# Patient Record
Sex: Female | Born: 1950 | Race: White | Hispanic: No | Marital: Married | Smoking: Current some day smoker
Health system: Southern US, Community
[De-identification: ages and names within clinical notes are randomized; demographics above are authoritative.]

## PROBLEM LIST (undated history)

## (undated) DIAGNOSIS — R188 Other ascites: Secondary | ICD-10-CM

## (undated) DIAGNOSIS — I10 Essential (primary) hypertension: Secondary | ICD-10-CM

## (undated) DIAGNOSIS — K7460 Unspecified cirrhosis of liver: Secondary | ICD-10-CM

## (undated) DIAGNOSIS — E119 Type 2 diabetes mellitus without complications: Secondary | ICD-10-CM

## (undated) DIAGNOSIS — E785 Hyperlipidemia, unspecified: Secondary | ICD-10-CM

## (undated) DIAGNOSIS — M1990 Unspecified osteoarthritis, unspecified site: Secondary | ICD-10-CM

## (undated) HISTORY — PX: SHOULDER SURGERY: SHX246

## (undated) HISTORY — DX: Unspecified cirrhosis of liver: K74.60

## (undated) HISTORY — DX: Other ascites: R18.8

## (undated) HISTORY — PX: ABDOMINAL HYSTERECTOMY: SHX81

## (undated) HISTORY — DX: Type 2 diabetes mellitus without complications: E11.9

## (undated) HISTORY — DX: Essential (primary) hypertension: I10

## (undated) HISTORY — DX: Hyperlipidemia, unspecified: E78.5

## (undated) HISTORY — DX: Unspecified osteoarthritis, unspecified site: M19.90

## (undated) HISTORY — PX: APPENDECTOMY: SHX54

## (undated) HISTORY — PX: CARPAL TUNNEL RELEASE: SHX101

---

## 2007-10-03 ENCOUNTER — Ambulatory Visit: Payer: Self-pay | Admitting: Cardiology

## 2007-10-04 ENCOUNTER — Inpatient Hospital Stay (HOSPITAL_COMMUNITY): Admission: AD | Admit: 2007-10-04 | Discharge: 2007-10-05 | Payer: Self-pay | Admitting: Cardiology

## 2007-10-04 ENCOUNTER — Ambulatory Visit: Payer: Self-pay | Admitting: Cardiology

## 2007-10-25 ENCOUNTER — Ambulatory Visit: Payer: Self-pay | Admitting: Cardiology

## 2009-04-30 ENCOUNTER — Encounter (INDEPENDENT_AMBULATORY_CARE_PROVIDER_SITE_OTHER): Payer: Self-pay | Admitting: Urology

## 2009-04-30 ENCOUNTER — Ambulatory Visit (HOSPITAL_COMMUNITY): Admission: RE | Admit: 2009-04-30 | Discharge: 2009-04-30 | Payer: Self-pay | Admitting: Urology

## 2011-02-20 LAB — BASIC METABOLIC PANEL
BUN: 10 mg/dL (ref 6–23)
CO2: 27 mEq/L (ref 19–32)
Calcium: 9.2 mg/dL (ref 8.4–10.5)
Chloride: 109 mEq/L (ref 96–112)
Creatinine, Ser: 0.67 mg/dL (ref 0.4–1.2)
Potassium: 4.5 mEq/L (ref 3.5–5.1)
Sodium: 139 mEq/L (ref 135–145)

## 2011-03-28 NOTE — Cardiovascular Report (Signed)
Erin Wilson, Erin Wilson               ACCOUNT NO.:  [id number]   MEDICAL RECORD NO.:  [id number]          PATIENT TYPE:  INP   LOCATION:  3730                         FACILITY:  [facility]   PHYSICIAN:  Everardo Beals. Juanda Chance, MD, FACCDATE OF BIRTH:  11/09/1951   DATE OF PROCEDURE:  10/04/2007  DATE OF DISCHARGE:                            CARDIAC CATHETERIZATION   CLINICAL HISTORY:  Erin Wilson is 60 years old and has known coronary  artery disease.  She had a stent placed in the right coronary at [facility] in 2002.  Recently she developed symptoms of exertional chest  pressure over the last several weeks.  She was admitted to [facility] with chest pain and was seen in consultation by Dr. Myrtis Ser.  Her  ECG showed anterior T wave inversion which was new since her previous  ECG in 1999.  She was transferred to [facility] for evaluation with  angiography with a  presumptive diagnosis of unstable angina.   PROCEDURE:  The procedure was performed via the right femoral artery  using an arterial sheath and 6-French preformed coronary catheters.  A  front wall arterial puncture was performed.  Omnipaque contrast was  used.  The right femoral artery was closed with Angio-Seal at the end of  the procedure  The patient tolerated the procedure well and left the  laboratory in satisfactory condition.  There was some spasm in the right  coronary artery at the site of the catheter, and we gave intracoronary  nitroglycerin and performed repeat injections and the spasm had  resolved.   RESULTS:  The left main coronary was free of significant disease.   The left anterior descending artery gave rise to two septal perforators  and a diagonal branch.  The LAD was small in caliber in the mid and  distal portion, but it was smooth, and there was no obvious obstruction.   The circumflex artery gave rise to two small marginal branches, a third  marginal branch, and two posterolateral branches.  These  vessels were  free of significant disease.   The right coronary artery was a moderately large vessel and gave rise to  a right ventricular branch, a posterior descending branch, and a  posterolateral branch.  There was less than 20% narrowing at the stent  site in the distal right coronary artery.  There was 50% narrowing in  the distal right coronary beyond the stent before the posterior  descending branch.  There were some irregularities in the proximal and  midportion of the vessel.  There was catheter-induced spasm at the  ostium which resolved with nitroglycerin.   The left ventriculogram performed in the RAO projection showed good wall  motion with no areas of hypokinesis.  The estimated ejection fraction  was 60%.   CONCLUSION:  Nonobstructive coronary artery disease with no significant  obstruction in the left anterior descending and circumflex arteries,  less than 20% narrowing at the stent site in the distal right coronary,  and 50% narrowing in the distal right coronary artery beyond the stent  with normal left ventricular function.  RECOMMENDATIONS:  The patient has nonobstructive disease with no clear  source of ischemia.  In view of these findings, I suspect her recent  symptoms are probably not related to myocardial ischemia.  She feels  some of her symptoms may be related to deconditioning.  We will plan to  keep her tonight and plan discharge tomorrow with followup with Dr. Myrtis Ser  and Dr. Linna Darner.      Bruce Elvera Lennox Juanda Chance, MD, [facility]  Electronically Signed     BRB/MEDQ  D:  10/04/2007  T:  10/05/2007  Job:  [id number]   cc:   Erasmo Downer, MD  Luis Abed, MD, [facility]  Cardiopulmonary Lab

## 2011-03-28 NOTE — Cardiovascular Report (Signed)
Erin Wilson, Erin Wilson               ACCOUNT NO.:  [id number]   MEDICAL RECORD NO.:  [id number]          PATIENT TYPE:  INP   LOCATION:  3730                         FACILITY:  [facility]   PHYSICIAN:  Everardo Beals. Juanda Chance, MD, FACCDATE OF BIRTH:  [birth year]   DATE OF PROCEDURE:  10/04/2007  DATE OF DISCHARGE:  10/05/2007                            CARDIAC CATHETERIZATION   PAST MEDICAL HISTORY:  Ms. Tristan is a 60 year old, who had a previous  stent placed in the right coronary artery at [facility].  She is  admitted to [facility] with chest pain and T-wave changes and was  transferred to [facility] hospital for evaluation with angiography.   PROCEDURE:  The procedure was performed via the femoral artery with  arterial sheath and 6-French preformed coronary catheters.  A femoral  arterial puncture was performed and Omnipaque contrast was used.  The  patient tolerated the procedure well and left the laboratory in  satisfactory condition.   RESULTS:  Left main coronary is free of disease.   Left anterior descending artery gave rise to the diagonal branch and two  septal perforators.  There was mild diffuse narrowing in the LAD, but no  significant obstruction.   The circumflex artery gave rise to two small marginal branches, a large  marginal branch and two posterolateral branches.  These vessels were  free of significant disease.   The right coronary was a moderate-sized vessel which gave rise to a the  right ventricular branch, posterior descending branch and the  posterolateral branch.  There was less than 20% narrowing at the stent  site in the distal right coronary artery.  There was 50% narrowing  distal to the stent.  There is some catheter spasm in the proximal right  coronary which resolved with nitroglycerin.   The left ventricle from the RAO projection showed good wall motion with  no areas of hypokinesis.  Estimated ejection fraction was 60%.   CONCLUSION:   Nonobstructive coronary artery disease with mild diffuse  narrowing in the LAD, no significant obstruction of circumflex artery,  less than 20% narrowing at the stent site in the mid to distal right  coronary artery and 50% narrowing in the distal right coronary with  normal LV function.   RECOMMENDATIONS:  Reassurance.  We will discuss with Dr. Myrtis Ser regarding  further evaluation of the patient's stent.      Bruce Elvera Lennox Juanda Chance, MD, [facility]  Electronically Signed     BRB/MEDQ  D:  12/03/2007  T:  12/04/2007  Job:  [id number]   cc:   Erasmo Downer, MD  Luis Abed, MD, [facility]  Bruce R. Juanda Chance, MD, [facility]  Cardiopulmonary Lab

## 2011-03-28 NOTE — Discharge Summary (Signed)
Erin, Wilson NO.:  [id number]   MEDICAL RECORD NO.:  [id number]          PATIENT TYPE:  INP   LOCATION:  3730                         FACILITY:  [facility]   PHYSICIAN:  Luis Abed, MD, FACCDATE OF BIRTH:  03-May-1951   DATE OF ADMISSION:  10/04/2007  DATE OF DISCHARGE:  10/05/2007                               DISCHARGE SUMMARY   Erin Wilson was brought to [facility]. [facility] urgently on  October 04, 2007.  The H&P  information exists in a full dictated  consult note that was done at [facility] and is to be brought to  this chart.  The prior cardiac care had been done at [facility].  The  patient had had any intervention in the past in 2002.  Follow up  catheterization in 2003, showed that the stent to her right coronary was  open and that she had no marked residual disease.  Very recently, the  patient had some chest and neck pressure at rest.  She gave some blood  recently and since then she has had the chest and neck pressure on a  regular basis and she was admitted to [facility].  Her anterior T-  waves were inverted.  The only EKG we had for comparison was done in  1999 and this was a new EKG change since then.  The patient appeared to  be unstable and she was brought to [employer] for  immediate catheterization.   The labs from [facility] are in the chart.  Here, her hemoglobin  is 11.6.  BUN was 13 with a creatinine of 0.9.  Her chest x-ray revealed  no active cardiopulmonary disease.  Her renal function was stable from  the outside labs.  The patient was taken to the cardiac cath lab.  This  study was done on October 04, 2007, by Dr. Charlies Constable.  There was  some catheter induced spasm of the right coronary artery but this was  not a significant finding.  There was only a 20% stenosis at the stent  in the right coronary artery.  There was a 50% stenosis of the right  coronary artery after  the stent.  There was mild diffuse disease in the  diagonal.  The ejection fraction was 60%.  It was felt that she was not  having an acute cardiac event.  It was felt that she was not having any  significant ischemia.  She was stable during the night last night.   This morning, her right groin is stable.  There is no hematoma.  She has  had no recurring chest pain and she is doing well.  Her vital signs are  stable and she is ready to go home.   FINAL DIAGNOSES:  1. History of significant coronary disease with a stent to the right      coronary artery in 2002.  2. Abnormal EKG.  3. Recurring chest discomfort on October 04, 2007, with the transfer      and urgent catheterization on October 04, 2007, with the data as  shown above.   She is stable and she can go home.  The patient is on appropriate  cardiac medicines.  She will, at this point, continue her home  medicines.  She needs to be on a Statin.  As of today, she prefers not  to start one.  This will be discussed when we see her as an outpatient.  The patient does smoke.  I personally counseled her to stop smoking and  encouraged her to ask for help and follow-up.   DISCHARGE MEDICATIONS:  1. Toprol XL 100.  2. Lasix 40.  3. Prevacid 30.  4. Potassium 10.  5. Imdur 30.  6. Aspirin 81.   The patient will follow-up in the [place], [place], office of  [facility].  We will call her with an appointment.  She will  follow up also with her primary care doctor, Dr. Linna Darner.   FINAL DIAGNOSES:  1. Known coronary disease.  2. Presentation with chest discomfort.  Chest pain is not cardiac.      She is stable and ready to go home.      Luis Abed, MD, [facility]  Electronically Signed     JDK/MEDQ  D:  10/05/2007  T:  10/06/2007  Job:  [id number]   cc:   [facility]  Erasmo Downer, MD

## 2011-03-28 NOTE — Op Note (Signed)
NAME:  CHARDE, MACFARLANE NO.:  [id number]   MEDICAL RECORD NO.:  [id number]          PATIENT TYPE:  AMB   LOCATION:  DAY                           FACILITY:  [facility]   PHYSICIAN:  Ky Barban, M.D.DATE OF BIRTH:  [birth year]   DATE OF PROCEDURE:  DATE OF DISCHARGE:                               OPERATIVE REPORT   PREOPERATIVE DIAGNOSIS:  Stress urinary incontinence.   POSTOPERATIVE DIAGNOSIS:  Stress urinary incontinence.   PROCEDURE:  Removal of MiniArc and insertion of tension-free vaginal  tape.   ANESTHESIA:  General.   PROCEDURE IN DETAIL:  The patient under general anesthesia in lithotomy  position, as usual prep and drape, #20 Foley catheter inserted into the  bladder and the suprapubic area was marked on both sides of midline at  the level of the pubic tubercle, then base of the urethra infiltrated  with 10 mL of normal saline.  Bladder neck was also marked.  After  placing a Sims speculum in the vagina and looking directly over the  urethra, incision was made over the mid urethra ending about centimeter  proximal to the meatus and through this incision, I can see the MiniArc  tape.  A right angle was passed along it and the tape first on the left  side was cut, closed to the pubic ramus and then it was grabbed with the  help of a hemostat and the remaining tape from the right side was cut,  piece of this MiniArc tape was sent to the lab for the record.  The  urethra grossly looks normal from outside.  The wound was irrigated with  saline.  I proceeded to insert the TVT and using the special curved  needle, it was introduced on the left side after inserting a catheter  with a stylet and deflecting the bladder neck to the right side,  inserted the needle on the left side and hugging the back of the pubic  symphysis, I came out at the previously marked site in the level of the  pubic tubercle.  The tape was pulled up in the suprapubic area and the  bladder was inspected after filling up with 300 mL of normal saline with  right angle lens to make sure there is no tape in the bladder and now  the bladder was emptied and catheter with stylet was introduced again,  and now the needle was passed on the right side of the mid urethra.  In  the same fashion, came out at the level of the pubic tubercle.  Tape was  pulled towards the suprapubic area.  Bladder again inspected with right  angle lens to make sure there is no tape in the bladder.  Once I made  sure there is no tape in the bladder, the bladder was emptied.  A #20  Foley catheter was inserted into the bladder.  Tension of the tape was  adjusted over the urethra and the plastic covering of the tape was  separated at the level of the urethra and I irrigated the sheathing with  saline.  I can see  the saline coming through the sheath into the vagina.  After adjusting the tension on the tape, the plastic sheathing was  removed.  This tape was lying gently against the mid urethra.  The  wound was irrigated and closed with 3-0 Vicryl interrupted sutures.  There was no bleeding.  In the suprapubic side, the tape was cut, flush  with the skin, Foley catheter was removed.  The patient left the  operating room in satisfactory condition.      Ky Barban, M.D.  Electronically Signed     MIJ/MEDQ  D:  04/30/2009  T:  05/01/2009  Job:  [id number]

## 2011-03-28 NOTE — H&P (Signed)
NAME:  Erin Wilson, Erin Wilson NO.:  [id number]   MEDICAL RECORD NO.:  [id number]          PATIENT TYPE:  AMB   LOCATION:  DAY                           FACILITY:  [facility]   PHYSICIAN:  Ky Barban, M.D.DATE OF BIRTH:  [birth year]   DATE OF ADMISSION:  DATE OF DISCHARGE:  LH                              HISTORY & PHYSICAL   CHIEF COMPLAINT:  Stress urinary incontinence.   HISTORY:  A 60 year old female who has marked stress incontinence, some  urgency, urge incontinence also.  It should be mentioned that I did a  MiniArc procedure for stress incontinence in January.  She did okay for  few months, started to have severe incontinence again.  I have told her  that I can try to do a tension-free vaginal tape, and she is willing to  try that.  She is coming as an outpatient.  We will do the procedure.  No guarantee __________ . In case postoperatively she goes into  retention, I may have to remove the tape.   PAST MEDICAL HISTORY:  No history of diabetes or hypertension.  She had  hysterectomy for fibroid 30 years ago.  Right shoulder rotator cuff  surgery in 2002.  Bilateral carpal tunnel surgery in 2001.  Coronary  artery stent in 2002.  She still has angina, takes medications.   PERSONAL HISTORY:  She does not smoke or drink.   REVIEW OF SYSTEMS:  Unremarkable.   PHYSICAL EXAMINATION:  VITAL SIGNS:  Blood pressure 120/80.  Temperature  is normal.  CENTRAL NERVOUS SYSTEM:  No gross neurological deficit.  HEAD, NECK, EYES, AND ENT:  Negative.  CHEST:  Symmetrical.  HEART:  Regular sinus rhythm.  ABDOMEN:  Soft, flat.  Liver, spleen, kidneys not palpable.  No CVA  tenderness.  PELVIC:  No adnexal mass or tenderness.   IMPRESSION:  Stress urinary and urge incontinence.   PLAN:  Tension-free vaginal tape under anesthesia as an outpatient.      Ky Barban, M.D.  Electronically Signed     MIJ/MEDQ  D:  04/28/2009  T:  04/29/2009  Job:  [id number]

## 2011-03-28 NOTE — Assessment & Plan Note (Signed)
[facility]                          [facility] NOTE   EFFA, YARROW                      MRN:          [id number]  DATE:10/25/2007                            DOB:          May 05, 1951    PRIMARY CARDIOLOGIST:  Luis Abed, MD, [facility]   REASON FOR VISIT:  Post hospitalization follow-up.   The patient is a 60 year old female, with prior history of documented  coronary artery disease status post stenting of the RCA in 2002 [facility]),  whom we recently saw here in consultation at [facility].  The  patient presented with symptoms which were worrisome for unstable angina  pectoris in the setting of an abnormal resting electrocardiogram, but  with negative serial cardiac markers.  Her most recent catheterization  was in June of 2003, revealing a widely patent RCA stent.  We  transferred her to [facility], where she underwent cardiac  catheterization by Bruce R. Juanda Chance, MD, [facility], and was found to have  nonobstructive CAD with less than 20% in-stent restenosis of the distal  RCA.  Left ventricular function was normal.   Dr. Juanda Chance felt that her symptoms were most likely not related to  myocardial ischemia, but rather to deconditioning.  She was discharged  the following day, by Dr. Myrtis Ser.   Since then, the patient continues to have occasional chest discomfort  which is atypical.  She reports no complications of right groin incision  site.  Unfortunately, she continues to smoke, but appears motivated and  has cut back considerably.   The patient also expressed concern about possible abdominal aortic  aneurysm.  Her father underwent surgery earlier this year, at age 16,  for repair of a 7 cm AAA at [facility].  The  patient tells me that she and her siblings were advised to undergo a  screening ultrasound, given that they are in their late 50's and at high  risk given the recent finding of a AAA with  their father.  The patient  states that she has never had a screening abdominal ultrasound.   Electrocardiogram today reveals NSR at 78 beats per minute with normal  axis and persistent T wave inversion in leads V3-V6.   CURRENT MEDICATIONS:  1. Aspirin 81 mg daily.  2. Crestor 5 mg daily.  3. Lasix 40 mg daily.  4. KCL 10 mg daily.  5. IMDUR 30 mg daily.  6. Toprol XL 100 mg daily.   PHYSICAL EXAMINATION:  VITAL SIGNS:  Blood pressure 125/78, pulse 78  regular, weight 187.  GENERAL:  A 60 year old female, mildly obese, sitting upright in no  distress.  HEENT:  Normocephalic and atraumatic.  NECK:  Palpable bilateral carotid pulses without bruits.  LUNGS:  Diminished breath sounds at bases, but without crackles or  wheezes.  HEART:  Regular rate and rhythm (S1 and S2).  No significant murmurs.  No rubs.  ABDOMEN:  Soft, nontender.  No pulsatile mass or associated bruit.  EXTREMITIES:  Palpable right femoral pulse with no ecchymosis, hematoma,  or bruit on auscultation; intact distal pulse.  NEUROLOGY:  No focal deficit.   IMPRESSION:  1. Stable, single vessel coronary artery disease.      a.     Status post recent cardiac catheterization revealing no       significant in-stent restenosis of the distal right coronary       artery.      b.     Status post stent right coronary artery, December 2002;       patent by cardiac catheterization in June of 2003.  2. Preserved left ventricular function.  3. Chronic obstructive pulmonary disease - ongoing tobacco.  4. Dyslipidemia.  5. Gastroesophageal reflux disease.  6. Abdominal discomfort.   PLAN:  1. Schedule screening abdominal ultrasound to rule out abdominal      aortic aneurysm.  2. Continue current medication regimen including Crestor, which the      patient was on prior to her admission here at [facility].      Continue aggressive lipid management with target LDL goal of 70, or      less.  3. Schedule return  clinic follow-up with myself and Dr. Myrtis Ser in three      months.      Rozell Searing, PA-C  Electronically Signed      Luis Abed, MD, [facility]  Electronically Signed   GS/MedQ  DD: 10/25/2007  DT: 10/26/2007  Job #: [id number]   cc:   Erasmo Downer, MD

## 2011-08-22 LAB — CBC
MCHC: 34.9
MCV: 95.5
Platelets: 150
RDW: 13
WBC: 7.3

## 2011-08-22 LAB — BASIC METABOLIC PANEL
BUN: 13
Glucose, Bld: 115 — ABNORMAL HIGH
Sodium: 141

## 2011-08-22 LAB — LIPID PANEL
Cholesterol: 123
Triglycerides: 229 — ABNORMAL HIGH
VLDL: 46 — ABNORMAL HIGH

## 2013-03-12 ENCOUNTER — Other Ambulatory Visit (HOSPITAL_COMMUNITY): Payer: Self-pay | Admitting: Rheumatology

## 2013-03-12 DIAGNOSIS — M545 Low back pain: Secondary | ICD-10-CM

## 2013-03-17 ENCOUNTER — Ambulatory Visit (HOSPITAL_COMMUNITY)
Admission: RE | Admit: 2013-03-17 | Discharge: 2013-03-17 | Disposition: A | Discharge status: Home / Self Care | Payer: No Typology Code available for payment source | Source: Ambulatory Visit | Attending: Rheumatology | Admitting: Rheumatology

## 2013-03-17 ENCOUNTER — Encounter (HOSPITAL_COMMUNITY): Payer: Self-pay

## 2013-03-17 DIAGNOSIS — M545 Low back pain, unspecified: Secondary | ICD-10-CM

## 2013-03-17 DIAGNOSIS — M5126 Other intervertebral disc displacement, lumbar region: Secondary | ICD-10-CM | POA: Insufficient documentation

## 2013-07-03 ENCOUNTER — Other Ambulatory Visit: Payer: Self-pay | Admitting: Rheumatology

## 2013-07-03 DIAGNOSIS — M5126 Other intervertebral disc displacement, lumbar region: Secondary | ICD-10-CM

## 2013-07-03 DIAGNOSIS — M5410 Radiculopathy, site unspecified: Secondary | ICD-10-CM

## 2013-07-04 ENCOUNTER — Ambulatory Visit
Admission: RE | Admit: 2013-07-04 | Discharge: 2013-07-04 | Disposition: A | Discharge status: Home / Self Care | Payer: No Typology Code available for payment source | Source: Ambulatory Visit | Attending: Rheumatology | Admitting: Rheumatology

## 2013-07-04 VITALS — BP 141/83 | HR 74

## 2013-07-04 DIAGNOSIS — M5126 Other intervertebral disc displacement, lumbar region: Secondary | ICD-10-CM

## 2013-07-04 DIAGNOSIS — M5410 Radiculopathy, site unspecified: Secondary | ICD-10-CM

## 2013-07-04 MED ORDER — IOHEXOL 180 MG/ML  SOLN
1.0000 mL | Freq: Once | INTRAMUSCULAR | Status: AC | PRN
  Administered 2013-07-04: 1 mL via EPIDURAL

## 2013-07-04 MED ORDER — METHYLPREDNISOLONE ACETATE 40 MG/ML INJ SUSP (RADIOLOG
120.0000 mg | Freq: Once | INTRAMUSCULAR | Status: AC
  Administered 2013-07-04: 120 mg via EPIDURAL

## 2013-07-09 ENCOUNTER — Other Ambulatory Visit: Payer: No Typology Code available for payment source

## 2013-07-30 ENCOUNTER — Other Ambulatory Visit: Payer: Self-pay | Admitting: Neurosurgery

## 2013-07-30 DIAGNOSIS — M549 Dorsalgia, unspecified: Secondary | ICD-10-CM

## 2013-11-26 ENCOUNTER — Ambulatory Visit
Admission: RE | Admit: 2013-11-26 | Discharge: 2013-11-26 | Disposition: A | Discharge status: Home / Self Care | Payer: No Typology Code available for payment source | Source: Ambulatory Visit | Attending: Rheumatology | Admitting: Rheumatology

## 2013-11-26 ENCOUNTER — Other Ambulatory Visit: Payer: Self-pay | Admitting: Rheumatology

## 2013-11-26 DIAGNOSIS — M25552 Pain in left hip: Secondary | ICD-10-CM

## 2013-11-26 DIAGNOSIS — M898X1 Other specified disorders of bone, shoulder: Secondary | ICD-10-CM

## 2013-11-26 DIAGNOSIS — M25551 Pain in right hip: Secondary | ICD-10-CM

## 2013-12-25 ENCOUNTER — Other Ambulatory Visit: Payer: Self-pay | Admitting: Rheumatology

## 2013-12-25 DIAGNOSIS — M169 Osteoarthritis of hip, unspecified: Secondary | ICD-10-CM

## 2013-12-26 ENCOUNTER — Ambulatory Visit
Admission: RE | Admit: 2013-12-26 | Discharge: 2013-12-26 | Disposition: A | Discharge status: Home / Self Care | Payer: No Typology Code available for payment source | Source: Ambulatory Visit | Attending: Rheumatology | Admitting: Rheumatology

## 2013-12-26 DIAGNOSIS — M169 Osteoarthritis of hip, unspecified: Secondary | ICD-10-CM

## 2013-12-26 MED ORDER — METHYLPREDNISOLONE ACETATE 40 MG/ML INJ SUSP (RADIOLOG
120.0000 mg | Freq: Once | INTRAMUSCULAR | Status: AC
  Administered 2013-12-26: 120 mg via INTRA_ARTICULAR

## 2013-12-26 MED ORDER — IOHEXOL 180 MG/ML  SOLN
1.0000 mL | Freq: Once | INTRAMUSCULAR | Status: AC | PRN
  Administered 2013-12-26: 1 mL via INTRA_ARTICULAR

## 2014-08-21 HISTORY — PX: ESOPHAGOGASTRODUODENOSCOPY: SHX1529

## 2015-11-22 ENCOUNTER — Encounter: Payer: Self-pay | Admitting: Gastroenterology

## 2015-12-07 ENCOUNTER — Encounter: Payer: Self-pay | Admitting: Nurse Practitioner

## 2015-12-07 ENCOUNTER — Ambulatory Visit (INDEPENDENT_AMBULATORY_CARE_PROVIDER_SITE_OTHER): Payer: BLUE CROSS/BLUE SHIELD | Admitting: Nurse Practitioner

## 2015-12-07 ENCOUNTER — Other Ambulatory Visit: Payer: Self-pay

## 2015-12-07 VITALS — BP 110/66 | HR 73 | Temp 98.2°F | Ht 65.0 in | Wt 199.2 lb

## 2015-12-07 DIAGNOSIS — K7460 Unspecified cirrhosis of liver: Secondary | ICD-10-CM

## 2015-12-07 DIAGNOSIS — R188 Other ascites: Principal | ICD-10-CM

## 2015-12-07 NOTE — Patient Instructions (Signed)
1. Have your labs drawn when you're able to. 2. We will hope be set up ultrasound with possible paracentesis. 3. Return for follow-up in 4 weeks.

## 2015-12-07 NOTE — Progress Notes (Signed)
Primary Care Physician:  Juliette Alcide, MD Primary Gastroenterologist:  Dr. Darrick Penna  Chief Complaint  Patient presents with  . Cirrhosis    HPI:   Exceed 65-year-old female referred by primary care for ascites. I saw PCP on 11/17/2015 which noted distended stomach, saw Dr. Teena Dunk who did not ultrasound and had a lot of fluid in her legs despite 2 fluid pills a day, per patient ultrasound showed a lot of ascites and she states she feels full can eat much. He initially saw Dr. Teena Dunk in spring of 2013 didn't think a biopsy was necessary further workup was negative for [facility] screening and advised ultrasound and AFP every 6 months. Ultrasounds were negative in 2013, declined 2014, normal 04/01/2013. February 2015 noted fatty liver, August 2015 noted mild cirrhosis. AFP 7 negative since 2013 with a range of 1.6-3.4. Alkaline phosphatase has ranged from 144-300 since 2014. AST ranges from 49-104, ALT ranges from 20-236. Per PCP note, patient was referred to see Dr. Teena Dunk back, referred to [place] for moderate to severe ascites. Appears to been referred for outpatient paracentesis at [facility] including cell count, culture, albumin, total protein. Noted lower cavity edema, patient also has a history of CHF. Normocytic anemia with a hemoglobin that is slowly trended down from January 2015 at 13.4-9.8 on December 2016. Labs ordered and resulted 11/13/2015 found normal TSH, hemoglobin was 9.8, platelets slightly depressed at 127, AFP normal at 2.1, creatinine normal 0.73, albumin low at 2.3, bili elevated at 1.7, alkaline phosphatase 202, AST/ALT stable at 49/29. Ultrasound of the abdomen completed 08/10/2015 found cirrhotic changes within the liver without evidence of abdominal mass, small amount of ascites adjacent to the otherwise normal-appearing gallbladder. Endoscopy completed on 08/21/2014 by Dr. Teena Dunk found normal esophagus, portal hypertensive gastropathy, no varices. Commended repeat EGD in 2  years.  Today she states the last time she saw Dr. Teena Dunk she complained of abdominal swelling and he said "just be glad it's not staying there" also developed swelling in her legs. This was in November 03, 2015. Swelling today feels a little better than it was. She is taking her diuretics as prescribed. Previously swelling up to her breasts and felt full, couldn't eat. Now that swelling has gown down, abdomen is sore but overall feels better. Takes Lasix 40 mg bid. Also with naval pain. Denies N/V, change in bowel habits. Had limited course of GI bug, which has resolved. Denies hematochezia, melena, excessive bleeding, yellowing of skin/eyes, acute episodic confusion. She was tested for hepatitis viruses a while ago, unsure which ones, possibly Hepatitis C. States her father died of liver disease. Denies chest pain, dyspnea, dizziness, lightheadedness, syncope, near syncope. Denies any other upper or lower GI symptoms.  Her PCP attempted to have a paracentesis done at [facility] but the tech told her he was afraid he would puncture her itnestines so the test was not completed.  No past medical history on file.  Past Surgical History  Procedure Laterality Date  . Abdominal hysterectomy    . Appendectomy      Current Outpatient Prescriptions  Medication Sig Dispense Refill  . BYETTA 10 MCG PEN 10 MCG/0.04ML SOPN injection     . FLUoxetine (PROZAC) 40 MG capsule     . furosemide (LASIX) 40 MG tablet     . isosorbide mononitrate (IMDUR) 30 MG 24 hr tablet     . lisinopril (PRINIVIL,ZESTRIL) 5 MG tablet     . metoprolol succinate (TOPROL-XL) 100 MG 24 hr tablet     .  nitroGLYCERIN (NITROSTAT) 0.4 MG SL tablet TNT  11  . pantoprazole (PROTONIX) 40 MG tablet     . traMADol (ULTRAM) 50 MG tablet      No current facility-administered medications for this visit.    Allergies as of 12/07/2015 - Review Complete 12/07/2015  Allergen Reaction Noted  . Penicillins Hives, Swelling, and Rash 07/04/2013     No family history on file.  Social History   Social History  . Marital Status: Married    Spouse Name: N/A  . Number of Children: N/A  . Years of Education: N/A   Occupational History  . Not on file.   Social History Main Topics  . Smoking status: Former Smoker    Quit date: 11/16/2015  . Smokeless tobacco: Not on file  . Alcohol Use: No  . Drug Use: No  . Sexual Activity: Not on file   Other Topics Concern  . Not on file   Social History Narrative    Review of Systems: 10-point ROS negative except as per HPI.    Physical Exam: BP 110/66 mmHg  Pulse 73  Temp(Src) 98.2 F (36.8 C) (Oral)  Ht  (1.651 m)  Wt 199 lb 3.2 oz (90.357 kg)  BMI 33.15 kg/m2 General:   Alert and oriented. Pleasant and cooperative. Well-nourished and well-developed.  Head:  Normocephalic and atraumatic. Eyes:  Without icterus, sclera clear and conjunctiva pink.  Ears:  Normal auditory acuity. Cardiovascular:  S1, S2 present without murmurs appreciated. Extremities without clubbing. 1-2+ bilateral lower extremity edema. Respiratory:  Clear to auscultation bilaterally. No wheezes, rales, or rhonchi. No distress.  Gastrointestinal:  +BS, soft. Mild to moderate distension, no tense ascites appreciated. No HSM noted. No guarding or rebound. No masses appreciated.  Rectal:  Deferred  Musculoskalatal:  Symmetrical without gross deformities. Skin:  Intact without significant lesions or rashes. Neurologic:  Alert and oriented x4;  grossly normal neurologically. Psych:  Alert and cooperative. Normal mood and affect. Heme/Lymph/Immune: No excessive bruising noted.    12/07/2015 11:50 AM

## 2015-12-08 NOTE — Progress Notes (Signed)
cc'ed to pcp °

## 2015-12-08 NOTE — Assessment & Plan Note (Addendum)
Patient with a history of cirrhosis previously seen Dr. Teena Dunk in [place], [place]. Her cirrhosis is competent located by congestive heart failure. Ultrasounds every 6 months from 2013 were essentially normal, February 2015 noted fatty liver, August 2015 noted mild cirrhosis. She was normocytic anemia, subtly depressed platelets. Abdominal ultrasound 08/10/2015 found cirrhotic changes without mass, small amount of ascites adjacent to the gallbladder. Endoscopy last completed 08/21/2014 with portal hypertensive gastropathy but no varices. Patient noted end of 2016 she began having worsening abdominal swelling and lower extremity edema. Her PCP increased her diuretics, currently on Lasix 40 mg twice a day. Spironolactone with potential interaction with her lisinopril. No other symptoms of severe liver disease. PCP attempted to order paracentesis at [facility] but detectable to her it could not be done because he was worried about puncturing her intestines. She feels her abdominal swelling and lower extremity swelling has improved since the diuretics. On exam she does have mild to moderate distention without tense ascites but potentially still abdominal ascites present.  Today I will order labs including CBC, CMP, PT/INR, hepatitis C antibody reflexed RNA, hepatitis B surface antigen, ANA, AMA of kidney-liver, anti-smooth muscle antibody, iron, sat, ferritin, ceruloplasmin we'll also order abdominal ultrasound with paracentesis if indicated. 25 g albumin after the first 4 L drawn, 12.5 g albumin after every additional 2 L. Return for follow-up in 4 weeks.

## 2015-12-09 ENCOUNTER — Other Ambulatory Visit (HOSPITAL_COMMUNITY): Payer: BLUE CROSS/BLUE SHIELD

## 2015-12-10 ENCOUNTER — Ambulatory Visit (HOSPITAL_COMMUNITY)
Admission: RE | Admit: 2015-12-10 | Discharge: 2015-12-10 | Disposition: A | Discharge status: Home / Self Care | Payer: BLUE CROSS/BLUE SHIELD | Source: Ambulatory Visit | Attending: Nurse Practitioner | Admitting: Nurse Practitioner

## 2015-12-10 ENCOUNTER — Other Ambulatory Visit: Payer: Self-pay | Admitting: *Deleted

## 2015-12-10 DIAGNOSIS — F17200 Nicotine dependence, unspecified, uncomplicated: Secondary | ICD-10-CM | POA: Diagnosis not present

## 2015-12-10 DIAGNOSIS — I739 Peripheral vascular disease, unspecified: Secondary | ICD-10-CM

## 2015-12-10 DIAGNOSIS — R188 Other ascites: Secondary | ICD-10-CM

## 2015-12-10 DIAGNOSIS — K7460 Unspecified cirrhosis of liver: Secondary | ICD-10-CM | POA: Diagnosis not present

## 2015-12-10 DIAGNOSIS — R161 Splenomegaly, not elsewhere classified: Secondary | ICD-10-CM | POA: Insufficient documentation

## 2016-01-05 ENCOUNTER — Encounter (INDEPENDENT_AMBULATORY_CARE_PROVIDER_SITE_OTHER): Payer: BLUE CROSS/BLUE SHIELD | Admitting: Gastroenterology

## 2016-01-05 ENCOUNTER — Encounter: Payer: Self-pay | Admitting: Gastroenterology

## 2016-01-05 ENCOUNTER — Telehealth: Payer: Self-pay | Admitting: Gastroenterology

## 2016-01-05 NOTE — Telephone Encounter (Signed)
REVIEWED-NO ADDITIONAL RECOMMENDATIONS. 

## 2016-01-05 NOTE — Telephone Encounter (Signed)
PATIENT WAS A NO SHOW AND LETTER SENT  °

## 2016-01-05 NOTE — Progress Notes (Signed)
   Subjective:    Patient ID: Erin Wilson, female    DOB: August 12, 1951, 65 y.o.   MRN: [id number]  NO SHOW/NO CALL  HPI   No past medical history on file.  Review of Systems     Objective:   Physical Exam        Assessment & Plan:

## 2016-01-11 ENCOUNTER — Encounter: Payer: Self-pay | Admitting: Vascular Surgery

## 2016-01-19 ENCOUNTER — Ambulatory Visit (INDEPENDENT_AMBULATORY_CARE_PROVIDER_SITE_OTHER): Payer: BLUE CROSS/BLUE SHIELD | Admitting: Vascular Surgery

## 2016-01-19 ENCOUNTER — Encounter: Payer: Self-pay | Admitting: Vascular Surgery

## 2016-01-19 ENCOUNTER — Ambulatory Visit (INDEPENDENT_AMBULATORY_CARE_PROVIDER_SITE_OTHER)
Admission: RE | Admit: 2016-01-19 | Discharge: 2016-01-19 | Disposition: A | Discharge status: Home / Self Care | Payer: BLUE CROSS/BLUE SHIELD | Source: Ambulatory Visit | Attending: Vascular Surgery | Admitting: Vascular Surgery

## 2016-01-19 ENCOUNTER — Ambulatory Visit (HOSPITAL_COMMUNITY)
Admission: RE | Admit: 2016-01-19 | Discharge: 2016-01-19 | Disposition: A | Discharge status: Home / Self Care | Payer: BLUE CROSS/BLUE SHIELD | Source: Ambulatory Visit | Attending: Vascular Surgery | Admitting: Vascular Surgery

## 2016-01-19 VITALS — BP 106/60 | HR 99 | Temp 98.2°F | Resp 24 | Ht 65.0 in | Wt 215.0 lb

## 2016-01-19 DIAGNOSIS — I10 Essential (primary) hypertension: Secondary | ICD-10-CM | POA: Diagnosis not present

## 2016-01-19 DIAGNOSIS — E119 Type 2 diabetes mellitus without complications: Secondary | ICD-10-CM | POA: Insufficient documentation

## 2016-01-19 DIAGNOSIS — I70209 Unspecified atherosclerosis of native arteries of extremities, unspecified extremity: Secondary | ICD-10-CM | POA: Diagnosis not present

## 2016-01-19 DIAGNOSIS — E785 Hyperlipidemia, unspecified: Secondary | ICD-10-CM | POA: Diagnosis not present

## 2016-01-19 DIAGNOSIS — I739 Peripheral vascular disease, unspecified: Secondary | ICD-10-CM

## 2016-01-19 DIAGNOSIS — M79606 Pain in leg, unspecified: Secondary | ICD-10-CM | POA: Diagnosis not present

## 2016-01-19 DIAGNOSIS — R0989 Other specified symptoms and signs involving the circulatory and respiratory systems: Secondary | ICD-10-CM | POA: Diagnosis present

## 2016-01-19 NOTE — Progress Notes (Signed)
[facility] [place]  Patient name: Erin Wilson MRN: [id number] DOB: Oct 24, 1951 Sex: female  REASON FOR CONSULT: Abnormal ABIs. Referred by Dr. Quintin Alto.  HPI: Erin Wilson is a 65 y.o. female, who is referred for evaluation of peripheral vascular disease. She has had bilateral lower extremity pain for about 2 years. This came on gradually. She experiences pain in her calf and thighs with ambulation which is relieved somewhat with   rest. However she experiences this pain also with sitting and standing. Her symptoms are more significant on the right side. She denies any significant back pain. She does have a history of paresthesias in her feet and possible neuropathy.Her past history is significant for fatty liver. Lungs with ascites.  I do not get any history of rest pain. She does have pain in her feet at night but states that hanging her feet down makes the pain worse which would not be consistent with rest pain. There is no history of nonhealing ulcers.  I have reviewed the records that were sent with the patient. The patient did have an arterial Doppler study on 12/01/2015 which showed an ABI of 0.81 on the right and 0.92 on the left. Patient was being worked up for bilateral lower extremity claudication. The patient also has a history of nonalcoholic cirrhosis. She is not on statins because of a liver issue. She has non-insulin-dependent diabetes which is under good control. She also has a history of coronary artery disease. She has bilateral lower extremity edema. Labs from 11/12/2015 were reviewed. Her HDL is 23. LDL is 82. Creatinine is 0.73. GFR is normal.  Past Medical History  Diagnosis Date  . Hypertension   . Hyperlipidemia   . Diabetes mellitus without complication (HCC)   . Arthritis     Family History  Problem Relation Age of Onset  . Colon cancer Neg Hx   . Liver disease Father     Possible, had jaundice  . Diverticulitis Mother   .  Cancer Father     Some form of blood cancer, unknown kind    SOCIAL HISTORY: Social History   Social History  . Marital Status: Married    Spouse Name: N/A  . Number of Children: N/A  . Years of Education: N/A   Occupational History  . Not on file.   Social History Main Topics  . Smoking status: Former Smoker    Quit date: 11/16/2015  . Smokeless tobacco: Never Used  . Alcohol Use: No     Comment: Drank socialy decades ago.  . Drug Use: No  . Sexual Activity: Not on file   Other Topics Concern  . Not on file   Social History Narrative    Allergies  Allergen Reactions  . Penicillins Hives, Swelling and Rash    Other reaction(s): Other (See Comments) All over body    Current Outpatient Prescriptions  Medication Sig Dispense Refill  . BYETTA 10 MCG PEN 10 MCG/0.04ML SOPN injection     . cilostazol (PLETAL) 50 MG tablet Take 50 mg by mouth 2 (two) times daily.    Marland Kitchen FLUoxetine (PROZAC) 40 MG capsule     . furosemide (LASIX) 40 MG tablet     . isosorbide mononitrate (IMDUR) 30 MG 24 hr tablet     . lisinopril (PRINIVIL,ZESTRIL) 5 MG tablet     . metoprolol succinate (TOPROL-XL) 100 MG 24 hr tablet     . nitroGLYCERIN (NITROSTAT) 0.4 MG SL tablet TNT  11  . pantoprazole (PROTONIX) 40 MG tablet     . traMADol (ULTRAM) 50 MG tablet      No current facility-administered medications for this visit.    REVIEW OF SYSTEMS:   denotes positive finding,  denotes negative finding Cardiac  Comments:  Chest pain or chest pressure:    Shortness of breath upon exertion: X   Short of breath when lying flat:    Irregular heart rhythm: X       Vascular    Pain in calf, thigh, or hip brought on by ambulation:    Pain in feet at night that wakes you up from your sleep:     Blood clot in your veins:    Leg swelling:  X       Pulmonary    Oxygen at home:    Productive cough:  X   Wheezing:  X       Neurologic    Sudden weakness in arms or legs:  X   Sudden numbness  in arms or legs:     Sudden onset of difficulty speaking or slurred speech:    Temporary loss of vision in one eye:     Problems with dizziness:  X       Gastrointestinal    Blood in stool:     Vomited blood:         Genitourinary    Burning when urinating:     Blood in urine:        Psychiatric    Major depression:         Hematologic    Bleeding problems:    Problems with blood clotting too easily:        Skin    Rashes or ulcers:        Constitutional    Fever or chills:      PHYSICAL EXAM: Filed Vitals:   01/19/16 1028  BP: 106/60  Pulse: 99  Temp: 98.2 F (36.8 C)  Resp: 24  Height:  (1.651 m)  Weight: 215 lb (97.523 kg)  SpO2: 99%    GENERAL: The patient is a well-nourished female, in no acute distress. The vital signs are documented above. CARDIAC: There is a regular rate and rhythm.  VASCULAR: I do not detect carotid bruits. She has diminished femoral pulses. I cannot palpate popliteal or pedal pulses. She has bilateral lower extremity swelling. PULMONARY: There is good air exchange bilaterally without wheezing or rales. ABDOMEN: Soft and non-tender with normal pitched bowel sounds.  MUSCULOSKELETAL: There are no major deformities or cyanosis. NEUROLOGIC: No focal weakness or paresthesias are detected. SKIN: There are no ulcers or rashes noted. PSYCHIATRIC: The patient has a normal affect.  DATA:   LOWER EXTREMITY ARTERIAL DOPPLER: Have independently interpreted her lower extremity arterial Doppler study.  On the right side, there are monophasic Doppler signals in the dorsalis pedis and posterior tibial positions with an ABI of 66%. Toe pressure on the left is 73 mmHg.  LOWER EXTREMITY ARTERIAL DUPLEX: I have independently interpreted her lower extremity arterial duplex. On the right side there is a monophasic common femoral artery waveform and monophasic signals throughout the entire right lower extremity. The left side there is a biphasic  common femoral artery waveform and biphasic signals throughout the entire left lower extremity. Superficial femoral arteries are patent bilaterally.  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 However, I cannot attribute all of her symptoms to her peripheral vascular disease. She experiences pain in her legs with simply standing and sitting. In addition she gets pain in her feet at night which is made worse by dependency which would not be consistent with rest pain. She is in the process of quitting smoking. I have encouraged her to ambulate as much as possible. At this point I would not recommend an aggressive approach and would D for arteriography and possible angioplasty and stenting if she developed progressive symptoms in her right lower extremity. Currently her ABIs are acceptable and her symptoms appear tolerable. I've ordered follow up ABIs in 1 year to see her back at that time. She knows to call sooner if she has problems.   Waverly Ferrariickson, [place] [facility] [place] Beeper: [phone]

## 2016-01-19 NOTE — Addendum Note (Signed)
Addended by: Dannielle KarvonenMANESS-HARRISON, Yalanda Soderman C on: 01/19/2016 02:55 PM   Modules accepted: Orders

## 2016-01-25 ENCOUNTER — Other Ambulatory Visit: Payer: Self-pay

## 2016-01-25 ENCOUNTER — Ambulatory Visit (INDEPENDENT_AMBULATORY_CARE_PROVIDER_SITE_OTHER): Payer: BLUE CROSS/BLUE SHIELD | Admitting: Nurse Practitioner

## 2016-01-25 ENCOUNTER — Encounter: Payer: Self-pay | Admitting: Nurse Practitioner

## 2016-01-25 VITALS — BP 136/70 | HR 109 | Temp 97.5°F | Ht 65.0 in | Wt 217.6 lb

## 2016-01-25 DIAGNOSIS — K7460 Unspecified cirrhosis of liver: Secondary | ICD-10-CM

## 2016-01-25 DIAGNOSIS — K7682 Hepatic encephalopathy: Secondary | ICD-10-CM

## 2016-01-25 DIAGNOSIS — R935 Abnormal findings on diagnostic imaging of other abdominal regions, including retroperitoneum: Secondary | ICD-10-CM | POA: Diagnosis not present

## 2016-01-25 DIAGNOSIS — R188 Other ascites: Secondary | ICD-10-CM | POA: Diagnosis not present

## 2016-01-25 DIAGNOSIS — K7290 Hepatic failure, unspecified without coma: Secondary | ICD-10-CM

## 2016-01-25 DIAGNOSIS — R9389 Abnormal findings on diagnostic imaging of other specified body structures: Secondary | ICD-10-CM

## 2016-01-25 LAB — CBC WITH DIFFERENTIAL/PLATELET
BASOS PCT: 0 % (ref 0–1)
Basophils Absolute: 0 10*3/uL (ref 0.0–0.1)
EOS ABS: 0.1 10*3/uL (ref 0.0–0.7)
Eosinophils Relative: 1 % (ref 0–5)
HCT: 27.8 % — ABNORMAL LOW (ref 36.0–46.0)
Hemoglobin: 9.3 g/dL — ABNORMAL LOW (ref 12.0–15.0)
Lymphocytes Relative: 17 % (ref 12–46)
Lymphs Abs: 1.3 10*3/uL (ref 0.7–4.0)
MCH: 31.2 pg (ref 26.0–34.0)
MCHC: 33.5 g/dL (ref 30.0–36.0)
MCV: 93.3 fL (ref 78.0–100.0)
MONOS PCT: 12 % (ref 3–12)
MPV: 10 fL (ref 8.6–12.4)
Monocytes Absolute: 0.9 10*3/uL (ref 0.1–1.0)
NEUTROS PCT: 70 % (ref 43–77)
Neutro Abs: 5.3 10*3/uL (ref 1.7–7.7)
PLATELETS: 167 10*3/uL (ref 150–400)
RBC: 2.98 MIL/uL — ABNORMAL LOW (ref 3.87–5.11)
RDW: 18 % — ABNORMAL HIGH (ref 11.5–15.5)
WBC: 7.5 10*3/uL (ref 4.0–10.5)

## 2016-01-25 LAB — COMPREHENSIVE METABOLIC PANEL
ALK PHOS: 187 U/L — AB (ref 33–130)
ALT: 17 U/L (ref 6–29)
AST: 45 U/L — AB (ref 10–35)
Albumin: 2 g/dL — ABNORMAL LOW (ref 3.6–5.1)
BILIRUBIN TOTAL: 1.1 mg/dL (ref 0.2–1.2)
BUN: 37 mg/dL — AB (ref 7–25)
CO2: 20 mmol/L (ref 20–31)
CREATININE: 1.59 mg/dL — AB (ref 0.50–0.99)
Calcium: 8.1 mg/dL — ABNORMAL LOW (ref 8.6–10.4)
Chloride: 108 mmol/L (ref 98–110)
GLUCOSE: 109 mg/dL — AB (ref 65–99)
Potassium: 3.6 mmol/L (ref 3.5–5.3)
SODIUM: 137 mmol/L (ref 135–146)
TOTAL PROTEIN: 5.9 g/dL — AB (ref 6.1–8.1)

## 2016-01-25 NOTE — Progress Notes (Signed)
Referring Provider: Juliette Alcide, MD Primary Care Physician:  Juliette Alcide, MD Primary GI:  Dr. Darrick Penna  Chief Complaint  Patient presents with  . seen at [facility] and seen a mass  . had fluid drawn off    HPI:   Erin Wilson is a 65 y.o. female who presents for concerns of a mass. Per chief complaint notes, patient went to "have fluid drawn off and was told there was a mass and needed to be seen by GI." She was last seen in our office 12/07/2015 for cirrhosis. Her cirrhosis is complicated by congestive heart failure. She previously seen Dr. Teena Dunk. At that time felt she had been having worsening abdominal swelling which in the week prior to her appointment had begun to improve on increased diuretics. Last EGD completed 08/21/2014 which found portal hypertensive gastropathy but no varices (report available in the "Media" tab dated 08/21/14). Multiple labs are ordered at her last visit including CBC, CMP, ceruloplasmin, PTT/INR, hepatitis B and C antibodies, ANA, antimicrosomal antibody of the kidney and liver, and anti-smooth muscle antibody. No results for any of these labs are in her system and appeared was not been drawn. Abdominal ultrasound ordered for evaluation of cirrhosis and possible paracentesis which found "Cirrhotic appearing liver with mild splenic enlargement and predominately perihepatic ascites, a small pocket of ascites is identified in the RIGHT lower quadrant, insufficient for expected significant therapeutic benefit from paracentesis,  inadequate visualization of pancreas, aorta, and portions of left kidney as above.  Records patient brought in documents admission to [facility] on 01/20/2016. She presented to ER with complaints of weakness and increased abdominal swelling previously unable to perform paracentesis due to no ascites pocket large enough to tap. Family stated of the previous couple weeks she become confused and gained 10-20 pounds of fluid. She was  started on lactulose, spironolactone, and breathing treatments. Therapeutic paracentesis was performed removing 3900 mL of fluid. When she was admitted she was hypotensive and anemic and deemed likely GI bleed, unspecified source. She received 2 units of blood. She was also positive for urinary tract infection gram-negative rods. On admission her blood pressure was 87/39, pulse 67. Ammonia was 66, hemoglobin 7.5, platelet count 139,000 on admission. Other admission labs included bilirubin of 2, creatinine 2.56, alkaline phosphatase 209. She was admitted to the ICU for 2 days for stabilization. During her hospital stay AST/ALT were normal. Hemoglobin improved to 9.3 after transfusion. No leukocytosis. CT of the abdomen and pelvis completed on 01/20/2016 found cirrhotic appearing liver, upper limit normal size spleen, moderate ascites. Also noted wall thickening at gastroesophageal junction unable to exclude mass lesion. Also noted gastrohepatic ligament lymph nodes up to 14 mm in diameter which are increased.  Today she states she's not doing great, but better than she was. She has a persistent cough, thinks she caught a cold while in the hospital. Confusion is better on lactulose, having 3-4 soft bowel movents a day. Denies hematochezia. States she's having black stools for the past month, typically with every bowel movement. Is also having worsening GERD symptoms. Has nausea and "water vomiting" about once a day. Denies yellowing of skin/eyes, urine is clear yellow but was dark brown before admission to [facility]. Denies generalized pruritis. Occasional mile RUQ and RLQ pain. Denies fever, chills. Has some more dyspnea with her cold with persistent cough. Has an appointment with PCP after she leaves here. Denies chest pain, dizziness, lightheadedness, syncope, near syncope. Denies any other upper or lower  GI symptoms.   Past Medical History  Diagnosis Date  . Hypertension   . Hyperlipidemia   . Diabetes  mellitus without complication (HCC)   . Arthritis   . Cirrhosis (HCC)   . Ascites     Past Surgical History  Procedure Laterality Date  . Abdominal hysterectomy    . Appendectomy    . Carpal tunnel release Bilateral   . Shoulder surgery Right   . Esophagogastroduodenoscopy  08/21/14    Dr. Teena DunkBenson: portal gastropathy, no varices.    Current Outpatient Prescriptions  Medication Sig Dispense Refill  . Albuterol Sulfate (VENTOLIN HFA IN) Inhale into the lungs.    Marland Kitchen. aspirin 81 MG tablet Take 81 mg by mouth daily.    Marland Kitchen. BYETTA 10 MCG PEN 10 MCG/0.04ML SOPN injection     . cyclobenzaprine (FLEXERIL) 10 MG tablet Take 10 mg by mouth 3 (three) times daily as needed for muscle spasms.    Marland Kitchen. FLUoxetine (PROZAC) 40 MG capsule     . furosemide (LASIX) 40 MG tablet     . isosorbide mononitrate (IMDUR) 30 MG 24 hr tablet     . lisinopril (PRINIVIL,ZESTRIL) 5 MG tablet     . metoprolol succinate (TOPROL-XL) 100 MG 24 hr tablet     . montelukast (SINGULAIR) 10 MG tablet Take 10 mg by mouth at bedtime.    . nitroGLYCERIN (NITROSTAT) 0.4 MG SL tablet TNT  11  . omeprazole (PRILOSEC) 20 MG capsule Take 20 mg by mouth daily.    . pantoprazole (PROTONIX) 40 MG tablet     . potassium chloride (K-DUR,KLOR-CON) 10 MEQ tablet     . traMADol (ULTRAM) 50 MG tablet      No current facility-administered medications for this visit.    Allergies as of 01/25/2016 - Review Complete 01/25/2016  Allergen Reaction Noted  . Penicillins Hives, Swelling, and Rash 07/04/2013    Family History  Problem Relation Age of Onset  . Colon cancer Neg Hx   . Liver disease Father     Possible, had jaundice  . Diverticulitis Mother   . Cancer Father     Some form of blood cancer, unknown kind    Social History   Social History  . Marital Status: Married    Spouse Name: N/A  . Number of Children: N/A  . Years of Education: N/A   Social History Main Topics  . Smoking status: Current Some Day Smoker -- 0.25  packs/day    Types: Cigarettes    Last Attempt to Quit: 11/16/2015  . Smokeless tobacco: Never Used  . Alcohol Use: No     Comment: Drank socialy decades ago.  . Drug Use: No  . Sexual Activity: Not Asked   Other Topics Concern  . None   Social History Narrative    Review of Systems: 10-point ROS negative except as per HPI.   Physical Exam: BP 136/70 mmHg  Pulse 109  Temp(Src) 97.5 F (36.4 C)  Ht 5\' 5"  (1.651 m)  Wt 217 lb 9.6 oz (98.703 kg)  BMI 36.21 kg/m2 General:   Alert and oriented. Pleasant and cooperative. Well-nourished and well-developed.  Head:  Normocephalic and atraumatic. Eyes:  Without icterus, sclera clear and conjunctiva pink.  Ears:  Normal auditory acuity. Cardiovascular:  S1, S2 present, systolic blowing murmur appreciated. 1+ bilateral lower extremity pitting edema. Respiratory:  Bilateral wheezing noted. No rales, or rhonchi. No distress. Persistent cough noted throughout her exam today. Gastrointestinal:  +BS, rounded, firm but no  tense ascites. Mild RUQ TTP. No guarding or rebound. Rectal:  Deferred  Musculoskalatal:  Symmetrical without gross deformities. Skin:  Intact without significant lesions or rashes. Flaking skin noted abdomen and bilateral lower extremities. Neurologic:  Alert and oriented x4;  grossly normal neurologically. Psych:  Alert and cooperative. Normal mood and affect. Heme/Lymph/Immune: No excessive bruising noted.    01/25/2016 3:19 PM   Disclaimer: This note was dictated with voice recognition software. Similar sounding words can inadvertently be transcribed and may not be corrected upon review.

## 2016-01-25 NOTE — Assessment & Plan Note (Signed)
Patient with CT of the abdomen and pelvis performed at [facility] which found thickening of the distal esophagus near the GE junction unable to exclude mass. Last colonoscopy in 2015 performed by Dr. Teena DunkBenson which was normal. At this point she is nearly due for variceal screening. We will set up an EGD with 25 mg preprocedure Phenergan to evaluate for varices as well to evaluate her distal esophagus for possible mass. Also with anemia on admission to [facility] with no definitive notes indicating source of bleeding. Does have a history of portal gastropathy and no varices on last EGD.  Updated endoscopy will allow for further evaluation.

## 2016-01-25 NOTE — Assessment & Plan Note (Signed)
Patient with hepatic encephalopathy on [facility]. She was placed on lactulose which is currently taking twice daily. Is having 3-4 soft bowel movements a day. Encephalopathy has resolved. Recommend she continue lactulose and if she develops further hepatic encephalopathy can consider adding Xifaxan. Return for follow-up in 4 weeks.

## 2016-01-25 NOTE — Assessment & Plan Note (Signed)
Patient with noted moderate ascites on admission to [facility], paracentesis performed with 3900 mL removed. No labs provided based on ascites fluid. We'll order an ultrasound to evaluate for further accumulation due to distended abdomen which is firm but not tense. May need near future paracentesis based on results of ultrasound. Return for follow-up in 4 weeks.

## 2016-01-25 NOTE — Assessment & Plan Note (Signed)
Patient with cirrhosis per imaging. She had been doing well with complaints of abdominal swelling but no ascites able to be tapped until recently when she was admitted to [facility]. At that point she was having confusion and worsening abdominal swelling with 3900 mL removed on paracentesis. Her situation is complicated by congestive heart failure as well. At this point I will reorder her labs that did not get drawn at her last visit including CBC, CBC and CMP, PTT/INR, hepatitis B and C antibodies, and ammonia for baseline. No confusion today, no jaundice noted. Some abdominal distention but not tense ascites. We'll also order an ultrasound of the abdomen to assess for repeat accumulating ascites fluid. May need paracentesis in the near future depending on the ultrasound findings. Return for follow-up in 4 weeks.

## 2016-01-25 NOTE — Patient Instructions (Signed)
1. We will schedule your procedure for you. 2. Have your labs drawn as soon as you're able to. 3. We will help you schedule your abdominal ultrasound to check for any reaccumulating fluid. 4. Return for follow-up in 4 weeks.

## 2016-01-26 LAB — HEPATITIS B SURFACE ANTIGEN: Hepatitis B Surface Ag: NEGATIVE

## 2016-01-26 LAB — PROTIME-INR
INR: 1.29 (ref <1.50)
PROTHROMBIN TIME: 16.3 s — AB (ref 11.6–15.2)

## 2016-01-26 LAB — AMMONIA: AMMONIA: 75 umol/L — AB (ref 16–53)

## 2016-01-26 LAB — HEPATITIS C ANTIBODY: HCV Ab: NEGATIVE

## 2016-01-31 ENCOUNTER — Inpatient Hospital Stay (HOSPITAL_COMMUNITY): Admission: RE | Admit: 2016-01-31 | Payer: BLUE CROSS/BLUE SHIELD | Source: Ambulatory Visit

## 2016-02-01 ENCOUNTER — Telehealth: Payer: Self-pay | Admitting: Nurse Practitioner

## 2016-02-01 ENCOUNTER — Other Ambulatory Visit: Payer: Self-pay

## 2016-02-01 DIAGNOSIS — D649 Anemia, unspecified: Secondary | ICD-10-CM

## 2016-02-01 DIAGNOSIS — R7989 Other specified abnormal findings of blood chemistry: Secondary | ICD-10-CM

## 2016-02-01 DIAGNOSIS — D582 Other hemoglobinopathies: Secondary | ICD-10-CM

## 2016-02-01 NOTE — Progress Notes (Signed)
Quick Note:  Pt is aware of her results. She would like the lab orders faxed to Dr. Cato MulliganBurdine's office at [place]. ______

## 2016-02-01 NOTE — Progress Notes (Signed)
Calculations based on labs at previous visit: MELD 14 Child-Pugh B

## 2016-02-01 NOTE — Telephone Encounter (Signed)
Labs entered in system. Please call patient (see result note for further details)

## 2016-02-01 NOTE — Progress Notes (Signed)
Quick Note:  I faxed the lab order to [place]. Pt is aware to keep appt for the procedure. ______

## 2016-02-01 NOTE — Telephone Encounter (Signed)
Noted  

## 2016-02-02 ENCOUNTER — Telehealth: Payer: Self-pay

## 2016-02-02 NOTE — Telephone Encounter (Signed)
Noted. Keep U/S and para appt for tomorrow. If any worsening symptoms (very short of breath, dizziness, passing out, etc) proceed to the ER. Otherwise follow-up as previously scheduled.

## 2016-02-02 NOTE — Telephone Encounter (Signed)
Dr. Leandrew KoyanagiBurdine called to see when her appointments were because she was confused about them. He also wanted us to know that she had a [facility]. They took off 3900 ml of fluid and she is leaking around the PARA site. The fluid has already came back. I told him that she was set up for an US and ? PARA with us tomorrow. He said that if we needed to talk to him that he would be around the office today. Please advise

## 2016-02-03 ENCOUNTER — Ambulatory Visit (HOSPITAL_COMMUNITY)
Admission: RE | Admit: 2016-02-03 | Discharge: 2016-02-03 | Disposition: A | Discharge status: Home / Self Care | Payer: BLUE CROSS/BLUE SHIELD | Source: Ambulatory Visit | Attending: Nurse Practitioner | Admitting: Nurse Practitioner

## 2016-02-03 ENCOUNTER — Other Ambulatory Visit: Payer: Self-pay

## 2016-02-03 ENCOUNTER — Encounter (HOSPITAL_COMMUNITY): Payer: Self-pay

## 2016-02-03 ENCOUNTER — Other Ambulatory Visit: Payer: Self-pay | Admitting: Nurse Practitioner

## 2016-02-03 VITALS — BP 98/51 | HR 60 | Temp 97.7°F | Resp 20

## 2016-02-03 DIAGNOSIS — R188 Other ascites: Secondary | ICD-10-CM

## 2016-02-03 DIAGNOSIS — K7460 Unspecified cirrhosis of liver: Secondary | ICD-10-CM

## 2016-02-03 DIAGNOSIS — K7682 Hepatic encephalopathy: Secondary | ICD-10-CM

## 2016-02-03 DIAGNOSIS — R935 Abnormal findings on diagnostic imaging of other abdominal regions, including retroperitoneum: Secondary | ICD-10-CM | POA: Insufficient documentation

## 2016-02-03 DIAGNOSIS — K7290 Hepatic failure, unspecified without coma: Secondary | ICD-10-CM

## 2016-02-03 NOTE — Progress Notes (Signed)
Paracentesis complete no signs of distress. 2600 ml amber colored ascites removed.  

## 2016-02-04 NOTE — Progress Notes (Signed)
Quick Note:  LM for a return call. ______ 

## 2016-02-08 NOTE — Telephone Encounter (Signed)
REVIEWED. AGREE. NO ADDITIONAL RECOMMENDATIONS. 

## 2016-02-11 ENCOUNTER — Encounter (HOSPITAL_COMMUNITY)
Admission: RE | Disposition: A | Discharge status: Home / Self Care | Payer: Self-pay | Source: Ambulatory Visit | Attending: Gastroenterology

## 2016-02-11 ENCOUNTER — Telehealth: Payer: Self-pay | Admitting: Gastroenterology

## 2016-02-11 ENCOUNTER — Ambulatory Visit (HOSPITAL_COMMUNITY)
Admission: RE | Admit: 2016-02-11 | Discharge: 2016-02-11 | Disposition: A | Discharge status: Home / Self Care | Payer: BLUE CROSS/BLUE SHIELD | Source: Ambulatory Visit | Attending: Gastroenterology | Admitting: Gastroenterology

## 2016-02-11 ENCOUNTER — Other Ambulatory Visit: Payer: Self-pay

## 2016-02-11 ENCOUNTER — Encounter (HOSPITAL_COMMUNITY): Payer: Self-pay | Admitting: *Deleted

## 2016-02-11 DIAGNOSIS — I8510 Secondary esophageal varices without bleeding: Secondary | ICD-10-CM | POA: Diagnosis not present

## 2016-02-11 DIAGNOSIS — E785 Hyperlipidemia, unspecified: Secondary | ICD-10-CM | POA: Diagnosis not present

## 2016-02-11 DIAGNOSIS — I10 Essential (primary) hypertension: Secondary | ICD-10-CM | POA: Diagnosis not present

## 2016-02-11 DIAGNOSIS — I864 Gastric varices: Secondary | ICD-10-CM | POA: Diagnosis not present

## 2016-02-11 DIAGNOSIS — K7460 Unspecified cirrhosis of liver: Secondary | ICD-10-CM | POA: Diagnosis not present

## 2016-02-11 DIAGNOSIS — I8500 Esophageal varices without bleeding: Secondary | ICD-10-CM | POA: Diagnosis not present

## 2016-02-11 DIAGNOSIS — R933 Abnormal findings on diagnostic imaging of other parts of digestive tract: Secondary | ICD-10-CM | POA: Diagnosis not present

## 2016-02-11 DIAGNOSIS — M1991 Primary osteoarthritis, unspecified site: Secondary | ICD-10-CM | POA: Insufficient documentation

## 2016-02-11 DIAGNOSIS — Z7982 Long term (current) use of aspirin: Secondary | ICD-10-CM | POA: Insufficient documentation

## 2016-02-11 DIAGNOSIS — K3189 Other diseases of stomach and duodenum: Secondary | ICD-10-CM | POA: Insufficient documentation

## 2016-02-11 DIAGNOSIS — K766 Portal hypertension: Secondary | ICD-10-CM | POA: Diagnosis not present

## 2016-02-11 DIAGNOSIS — F17210 Nicotine dependence, cigarettes, uncomplicated: Secondary | ICD-10-CM | POA: Insufficient documentation

## 2016-02-11 DIAGNOSIS — Z79899 Other long term (current) drug therapy: Secondary | ICD-10-CM | POA: Insufficient documentation

## 2016-02-11 DIAGNOSIS — R9389 Abnormal findings on diagnostic imaging of other specified body structures: Secondary | ICD-10-CM | POA: Insufficient documentation

## 2016-02-11 DIAGNOSIS — E119 Type 2 diabetes mellitus without complications: Secondary | ICD-10-CM | POA: Insufficient documentation

## 2016-02-11 DIAGNOSIS — R188 Other ascites: Secondary | ICD-10-CM

## 2016-02-11 HISTORY — PX: ESOPHAGOGASTRODUODENOSCOPY: SHX5428

## 2016-02-11 LAB — GLUCOSE, CAPILLARY: Glucose-Capillary: 95 mg/dL (ref 65–99)

## 2016-02-11 SURGERY — EGD (ESOPHAGOGASTRODUODENOSCOPY)
Anesthesia: Moderate Sedation

## 2016-02-11 MED ORDER — MEPERIDINE HCL 100 MG/ML IJ SOLN
INTRAMUSCULAR | Status: DC | PRN
  Administered 2016-02-11: 25 mg via INTRAVENOUS

## 2016-02-11 MED ORDER — MEPERIDINE HCL 100 MG/ML IJ SOLN
INTRAMUSCULAR | Status: AC
  Filled 2016-02-11: qty 2

## 2016-02-11 MED ORDER — STERILE WATER FOR IRRIGATION IR SOLN
Status: DC | PRN
  Administered 2016-02-11: 09:00:00

## 2016-02-11 MED ORDER — LIDOCAINE VISCOUS 2 % MT SOLN
OROMUCOSAL | Status: DC | PRN
Start: 2016-02-11 — End: 2016-02-11
  Administered 2016-02-11: 4 mL via OROMUCOSAL

## 2016-02-11 MED ORDER — SPIRONOLACTONE 100 MG PO TABS: 100.0000 mg | ORAL_TABLET | Freq: Every day | ORAL | Status: AC

## 2016-02-11 MED ORDER — NALOXONE HCL 0.4 MG/ML IJ SOLN
INTRAMUSCULAR | Status: AC
  Administered 2016-02-11: 0.4 mg via INTRAVENOUS
  Filled 2016-02-11: qty 1

## 2016-02-11 MED ORDER — FLUMAZENIL 0.5 MG/5ML IV SOLN
0.5000 mg | Freq: Once | INTRAVENOUS | Status: AC
  Administered 2016-02-11: 0.5 mg via INTRAVENOUS

## 2016-02-11 MED ORDER — PROMETHAZINE HCL 25 MG/ML IJ SOLN
INTRAMUSCULAR | Status: AC
  Filled 2016-02-11: qty 1

## 2016-02-11 MED ORDER — MIDAZOLAM HCL 5 MG/5ML IJ SOLN
INTRAMUSCULAR | Status: DC | PRN
  Administered 2016-02-11: 2 mg via INTRAVENOUS
  Administered 2016-02-11: 1 mg via INTRAVENOUS

## 2016-02-11 MED ORDER — MIDAZOLAM HCL 5 MG/5ML IJ SOLN
INTRAMUSCULAR | Status: AC
  Filled 2016-02-11: qty 10

## 2016-02-11 MED ORDER — SODIUM CHLORIDE 0.9 % IV SOLN
INTRAVENOUS | Status: DC
  Administered 2016-02-11: 1000 mL via INTRAVENOUS

## 2016-02-11 MED ORDER — SODIUM CHLORIDE 0.9% FLUSH
INTRAVENOUS | Status: AC
  Filled 2016-02-11: qty 10

## 2016-02-11 MED ORDER — PROMETHAZINE HCL 25 MG/ML IJ SOLN
25.0000 mg | Freq: Once | INTRAMUSCULAR | Status: AC
  Administered 2016-02-11: 25 mg via INTRAVENOUS

## 2016-02-11 MED ORDER — LIDOCAINE VISCOUS 2 % MT SOLN
OROMUCOSAL | Status: AC
  Filled 2016-02-11: qty 15

## 2016-02-11 MED ORDER — PROPRANOLOL HCL ER 60 MG PO CP24: 60.0000 mg | ORAL_CAPSULE | Freq: Every day | ORAL | Status: AC

## 2016-02-11 MED ORDER — FLUMAZENIL 0.5 MG/5ML IV SOLN
INTRAVENOUS | Status: AC
  Administered 2016-02-11: 0.5 mg via INTRAVENOUS
  Filled 2016-02-11: qty 5

## 2016-02-11 MED ORDER — NALOXONE HCL 0.4 MG/ML IJ SOLN
0.4000 mg | Freq: Once | INTRAMUSCULAR | Status: AC
  Administered 2016-02-11: 0.4 mg via INTRAVENOUS

## 2016-02-11 NOTE — Telephone Encounter (Signed)
PT needs a large volume paracentesis MON APR 3. SHE NEEDS ALBUMIN 25 GMS IV AT ONSET AND REPEAT AFTER 4 LS. SHE SHOULD HOLD HIS DIURETICS ON THE DAY OF THE PARACENTESIS. SHE NEEDS TO STRICTLY FOLLOW A LOW SALT DIET.

## 2016-02-11 NOTE — Discharge Instructions (Addendum)
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 THE BULGING VEINS ARE LOCATED WHERE THE ESOPHAGUS AND STOMACH MEET AND ARE CAUSING THE FINDING ON THE CT.   DO NOT TAKE ANY BLOOD PRESSURE OR FLUID PILLS TODAY.  STOP TOPROL XL. START PROPRANOLOL 60 MG DAILY.  STOP LISINOPRIL.  START LASIX AND ALDACTONE EVERY MORNING TO BETTER CONTROL FLUIDS AND REDUCE THE NUMBER OF TAPS.  PARACENTESIS NEXT WEEK. HOLD DIURETICS(LASIX/ALDACTONE) ON DAY OF PARACENTESIS.  CONTINUE OMEPRAZOLE every morning.  CONTINUE IMDUR.  WE WILL SEE YOU ON APR 12 TO CHECK BLOOD PRESSURE AND FLUID.  ROUTINE FOLLOW UP IN SEP 2017.  PARACENTESIS April 3 @ 2:00 PM.  HOLD ALL DIEURETICS ON MONDAY  UPPER ENDOSCOPY AFTER CARE Read the instructions outlined below and refer to this sheet in the next week. These discharge instructions provide you with general information on caring for yourself after you leave the hospital. While your treatment has been planned according to the most current medical practices available, unavoidable complications occasionally occur. If you have any problems or questions after discharge, call DR. FIELDS, [phone].  ACTIVITY  You may resume your regular activity, but move at a slower pace for the next 24 hours.   Take frequent rest periods for the next 24 hours.   Walking will help get rid of the air and reduce the bloated feeling in your belly (abdomen).   No driving for 24 hours (because of the medicine (anesthesia) used during the test).   You may shower.   Do not sign any important legal documents or operate any machinery for 24 hours (because of the anesthesia used during the test).    NUTRITION  Drink plenty of fluids.   You may resume your normal diet as instructed by your doctor.   Begin with a light meal and progress to your normal diet. Heavy or fried foods are harder to digest and may make  you feel sick to your stomach (nauseated).   Avoid alcoholic beverages for 24 hours or as instructed.    MEDICATIONS  You may resume your normal medications.   WHAT YOU CAN EXPECT TODAY  Some feelings of bloating in the abdomen.   Passage of more gas than usual.    IF YOU HAD A BIOPSY TAKEN DURING THE UPPER ENDOSCOPY:  Eat a soft diet IF YOU HAVE NAUSEA, BLOATING, ABDOMINAL PAIN, OR VOMITING.    FINDING OUT THE RESULTS OF YOUR TEST Not all test results are available during your visit. DR. Darrick PennaFIELDS WILL CALL YOU WITHIN 7 DAYS OF YOUR PROCEDUE WITH YOUR RESULTS. Do not assume everything is normal if you have not heard from DR. FIELDS IN ONE WEEK, CALL HER OFFICE AT [phone].  SEEK IMMEDIATE MEDICAL ATTENTION AND CALL THE OFFICE: [phone] IF:  You have more than a spotting of blood in your stool.   Your belly is swollen (abdominal distention).   You are nauseated or vomiting.   You have a temperature over 101F.   You have abdominal pain or discomfort that is severe or gets worse throughout the day.  PROPRANOLOL tablets  What is this medicine?   PROPRANOLOL is a beta-blocker. Beta-blockers reduce the workload on the heart and help it to beat more regularly. This medicine is used to treat high blood pressure and to relieve chest pain caused by angina.  This medicine may be used for other purposes; ask your health care provider or pharmacist if you  have questions.   How should I use this medicine?  Take this medicine by mouth with a glass of water. Follow the directions on the prescription label. You can take this medicine with or without food. Take your doses at regular intervals. Do not take your medicine more often than directed. Do not stop taking this medicine suddenly. This could lead to serious heart-related effects.    What if I miss a dose?   If you miss a dose, take it as soon as you can. If it is almost time for your next dose, take only that dose. Do not  take double or extra doses.   What should I watch for?  Visit your doctor or health care professional for regular check ups. Check your blood pressure and pulse rate regularly. Ask your health care professional what your blood pressure and pulse rate should be, and when you should contact them. You may get drowsy or dizzy. Do not drive, use machinery, or do anything that needs mental alertness until you know how this drug affects you. Do not stand or sit up quickly, especially if you are an older patient. This reduces the risk of dizzy or fainting spells. Alcohol can make you more drowsy and dizzy. Avoid alcoholic drinks.    What side effects may I notice from receiving this medicine?  Side effects that you should report to your doctor or health care professional as soon as possible:  -allergic reactions like skin rash, itching or hives  -cold, tingling, or numb hands or feet  -difficulty breathing, wheezing  -irregular heart beat  -mental depression  -slow heart rate  -swelling of the legs or ankles  -FATIGUE -change in sex drive or performance  -dry itching skin  -headache

## 2016-02-11 NOTE — H&P (Signed)
Primary Care Physician:  Juliette AlcideBURDINE,STEVEN E, MD Primary Gastroenterologist:  Dr. Darrick PennaFields  Pre-Procedure History & Physical: HPI:  Erin Wilson is a 65 y.o. female here for Abnormal CT scan: POSSIBLE SOFT TISSUE MASS AT GE JXN.   Past Medical History  Diagnosis Date  . Hypertension   . Hyperlipidemia   . Diabetes mellitus without complication (HCC)   . Arthritis   . Cirrhosis (HCC)   . Ascites     Past Surgical History  Procedure Laterality Date  . Abdominal hysterectomy    . Appendectomy    . Carpal tunnel release Bilateral   . Shoulder surgery Right   . Esophagogastroduodenoscopy  08/21/14    Dr. Teena DunkBenson: portal gastropathy, no varices.    Prior to Admission medications   Medication Sig Start Date End Date Taking? Authorizing Provider  Albuterol Sulfate (VENTOLIN HFA IN) Inhale into the lungs.   Yes Historical Provider, MD  aspirin 81 MG tablet Take 81 mg by mouth daily.   Yes Historical Provider, MD  BYETTA 10 MCG PEN 10 MCG/0.04ML SOPN injection  11/29/15  Yes Historical Provider, MD  furosemide (LASIX) 40 MG tablet  11/10/15  Yes Historical Provider, MD  isosorbide mononitrate (IMDUR) 30 MG 24 hr tablet  11/26/15  Yes Historical Provider, MD  metoprolol succinate (TOPROL-XL) 100 MG 24 hr tablet  11/26/15  Yes Historical Provider, MD  pantoprazole (PROTONIX) 40 MG tablet  11/10/15  Yes Historical Provider, MD  cyclobenzaprine (FLEXERIL) 10 MG tablet Take 10 mg by mouth 3 (three) times daily as needed for muscle spasms.    Historical Provider, MD  FLUoxetine (PROZAC) 40 MG capsule  05/24/14   Historical Provider, MD  lisinopril (PRINIVIL,ZESTRIL) 5 MG tablet  11/26/15   Historical Provider, MD  montelukast (SINGULAIR) 10 MG tablet Take 10 mg by mouth at bedtime.    Historical Provider, MD  nitroGLYCERIN (NITROSTAT) 0.4 MG SL tablet TNT 10/01/15   Historical Provider, MD  omeprazole (PRILOSEC) 20 MG capsule Take 20 mg by mouth daily.    Historical Provider, MD  potassium  chloride (K-DUR,KLOR-CON) 10 MEQ tablet  01/22/03   Historical Provider, MD  traMADol Janean Sark(ULTRAM) 50 MG tablet  01/08/13   Historical Provider, MD    Allergies as of 01/25/2016 - Review Complete 01/25/2016  Allergen Reaction Noted  . Penicillins Hives, Swelling, and Rash 07/04/2013    Family History  Problem Relation Age of Onset  . Colon cancer Neg Hx   . Liver disease Father     Possible, had jaundice  . Diverticulitis Mother   . Cancer Father     Some form of blood cancer, unknown kind    Social History   Social History  . Marital Status: Married    Spouse Name: N/A  . Number of Children: N/A  . Years of Education: N/A   Occupational History  . Not on file.   Social History Main Topics  . Smoking status: Current Some Day Smoker -- 0.25 packs/day    Types: Cigarettes    Last Attempt to Quit: 11/16/2015  . Smokeless tobacco: Never Used  . Alcohol Use: No     Comment: Drank socialy decades ago.  . Drug Use: No  . Sexual Activity: Not on file   Other Topics Concern  . Not on file   Social History Narrative    Review of Systems: See HPI, otherwise negative ROS   Physical Exam: BP 103/50 mmHg  Pulse 88  Temp(Src) 98.1 F (36.7 C) (Oral)  Resp 20  Ht  (1.651 m)  Wt 200 lb (90.719 kg)  BMI 33.28 kg/m2  SpO2 98% General:   Alert,  pleasant and cooperative in NAD Head:  Normocephalic and atraumatic. Neck:  Supple; Lungs:  Clear throughout to auscultation.    Heart:  Regular rate and rhythm. Abdomen:  Soft, nontender and nondistended. Normal bowel sounds, without guarding, and without rebound.   Neurologic:  Alert and  oriented x4;  grossly normal neurologically.  Impression/Plan:    Abnormal CT scan: POSSIBLE SOFT TISSUE MASS AT GE JXN.  Plan: 1. EGD TODAY

## 2016-02-11 NOTE — Op Note (Addendum)
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 Procedure:                Pre-Anesthesia Assessment:                           - Prior to the procedure, a History and Physical                            was performed, and patient medications and                            allergies were reviewed. The patient's tolerance of                            previous anesthesia was also reviewed. The risks                            and benefits of the procedure and the sedation                            options and risks were discussed with the patient.                            All questions were answered, and informed consent                            was obtained. Prior Anticoagulants: The patient has                            taken aspirin, last dose was day of procedure. ASA                            Grade Assessment: II - A patient with mild systemic  disease. After reviewing the risks and benefits,                            the patient was deemed in satisfactory condition to                            undergo the procedure.                           After obtaining informed consent, the endoscope was                            passed under direct vision. Throughout the                            procedure, the patient's blood  pressure, pulse, and                            oxygen saturations were monitored continuously. The                            [id number] ([id number]) scope was introduced through the                            mouth, and advanced to the second part of duodenum.                            The upper GI endoscopy was accomplished without                            difficulty. The patient tolerated the procedure                            well. Scope In: 9:39:25 AM Scope Out: 9:47:03 AM Total Procedure Duration: 0 hours 7 minutes 38 seconds  Findings:      Two columns of non-bleeding grade I, grade II varices were found in the       lower third of the esophagus,. They were 5 mm in largest diameter. No       red wale signs were present.      Moderate portal hypertensive gastropathy was found in the cardia, in the       gastric fundus, in the gastric body and in the gastric antrum.      Type 2 gastroesophageal varices (GOV2, esophageal varices which extend       along the fundus) with no bleeding were found in the stomach. There were       no stigmata of recent bleeding. They were medium in largest diameter.      The [Site] was normal. Impression:               - Non-bleeding grade I and grade II esophageal                            varices.                           -  Portal hypertensive gastropathy.                           - Type 2 gastroesophageal varices (GOV2, esophageal                            varices which extend along the fundus), without                            bleeding.                           - Normal [Site].                           - No specimens collected. Moderate Sedation:      Moderate (conscious) sedation was administered by the endoscopy nurse       and supervised by the endoscopist. The following parameters were       monitored: oxygen saturation, heart rate, blood pressure, and response       to care. Total physician intraservice time was 19  minutes. Recommendation:           - Patient has a contact number available for                            emergencies. The signs and symptoms of potential                            delayed complications were discussed with the                            patient. Return to normal activities tomorrow.                            Written discharge instructions were provided to the                            patient.                           DO NOT TAKE ANY BLOOD PRESSURE OR FLUID PILLS TODAY.                           STOP TOPROL XL. START PROPRANOLOL 60 MG DAILY.                           STOP LISINOPRIL.                           START LASIX AND ALDACTONE EVERY MORNING TO BETTER                            CONTROL .                           PARACENTESIS NEXT WEEK. HOLD  DIURETICS(LASIX/ALDACTONE) ON DAY OF PARACENTESIS.                           CONTINUE OMEPRAZOLE every morning.                           CONTINUE IMDUR.                           FOLLOW UP APR 12 TO CHECK BLOOD PRESSURE, BMP, AND                            FLUID.                           ROUTINE FOLLOW UP IN SEP 2017.                           PARACENTESIS April 3 @ 2:00 PM. HOLD ALL DIEURETICS                            ON MONDAY.                           - Post procedure medication instructions were                            provided to the patient.                           - High fiber diet. Procedure Code(s):        --- Professional ---                           [id number], Esophagogastroduodenoscopy, flexible,                            transoral; diagnostic, including collection of                            specimen(s) by brushing or washing, when performed                            (separate procedure)                           G0500, Moderate sedation services provided by the                            same physician or other qualified health care                             professional performing a gastrointestinal                            endoscopic service that sedation supports,  requiring the presence of an independent trained                            observer to assist in the monitoring of the                            patient's level of consciousness and physiological                            status; initial 15 minutes of intra-service time;                            patient age 43 years or older (additional time may                            be reported with 19147, as appropriate) Diagnosis Code(s):        --- Professional ---                           I85.00, Esophageal varices without bleeding                           K76.6, Portal hypertension                           K31.89, Other diseases of stomach and duodenum                           I86.4, Gastric varices CPT copyright 2016 American [employer] Association. All rights reserved. The codes documented in this report are preliminary and upon coder review may  be revised to meet current compliance requirements. Jonette Eva, MD Jonette Eva, MD 02/11/2016 11:47:19 PM This report has been signed electronically. Number of Addenda: 1 Addendum Number: 1   Addendum Date: 02/11/2016 11:48:32 PM      In postop PT hypotensive SBP 60-75 W/O WARNING SIGNS/MS INTACT. PT did       not respond to fluid. NARCAN/ROMAZICON GIVEN. PT D/C HOME WITH SBP > 95. Jonette Eva, MD Jonette Eva, MD 02/11/2016 11:50:28 PM This report has been signed electronically.

## 2016-02-11 NOTE — Telephone Encounter (Signed)
Pt is set up for Monday at 2:00. [facility] Anaracy in Endo is aware of appointment and will let patient know about it

## 2016-02-11 NOTE — Progress Notes (Signed)
REVIEWED-NO ADDITIONAL RECOMMENDATIONS. 

## 2016-02-14 ENCOUNTER — Ambulatory Visit (HOSPITAL_COMMUNITY)
Admission: RE | Admit: 2016-02-14 | Discharge: 2016-02-14 | Disposition: A | Discharge status: Home / Self Care | Payer: BLUE CROSS/BLUE SHIELD | Source: Ambulatory Visit | Attending: Gastroenterology | Admitting: Gastroenterology

## 2016-02-14 DIAGNOSIS — K7460 Unspecified cirrhosis of liver: Secondary | ICD-10-CM | POA: Insufficient documentation

## 2016-02-14 DIAGNOSIS — Z7952 Long term (current) use of systemic steroids: Secondary | ICD-10-CM | POA: Diagnosis not present

## 2016-02-14 DIAGNOSIS — Z90710 Acquired absence of both cervix and uterus: Secondary | ICD-10-CM | POA: Insufficient documentation

## 2016-02-14 DIAGNOSIS — F17210 Nicotine dependence, cigarettes, uncomplicated: Secondary | ICD-10-CM | POA: Insufficient documentation

## 2016-02-14 DIAGNOSIS — M1990 Unspecified osteoarthritis, unspecified site: Secondary | ICD-10-CM | POA: Diagnosis not present

## 2016-02-14 DIAGNOSIS — Z79899 Other long term (current) drug therapy: Secondary | ICD-10-CM | POA: Diagnosis not present

## 2016-02-14 DIAGNOSIS — Z809 Family history of malignant neoplasm, unspecified: Secondary | ICD-10-CM | POA: Insufficient documentation

## 2016-02-14 DIAGNOSIS — E119 Type 2 diabetes mellitus without complications: Secondary | ICD-10-CM | POA: Diagnosis present

## 2016-02-14 DIAGNOSIS — Z880 Allergy status to penicillin: Secondary | ICD-10-CM | POA: Insufficient documentation

## 2016-02-14 DIAGNOSIS — Z8379 Family history of other diseases of the digestive system: Secondary | ICD-10-CM | POA: Diagnosis not present

## 2016-02-14 DIAGNOSIS — R188 Other ascites: Secondary | ICD-10-CM | POA: Insufficient documentation

## 2016-02-14 DIAGNOSIS — E785 Hyperlipidemia, unspecified: Secondary | ICD-10-CM | POA: Diagnosis present

## 2016-02-14 DIAGNOSIS — I10 Essential (primary) hypertension: Secondary | ICD-10-CM | POA: Diagnosis present

## 2016-02-14 NOTE — Progress Notes (Signed)
Paracentesis complete no signs of distress. 6000 ml amber colored ascites removed.  

## 2016-02-16 ENCOUNTER — Encounter (HOSPITAL_COMMUNITY): Payer: Self-pay | Admitting: Gastroenterology

## 2016-02-23 ENCOUNTER — Encounter: Payer: Self-pay | Admitting: Gastroenterology

## 2016-02-23 ENCOUNTER — Other Ambulatory Visit: Payer: Self-pay

## 2016-02-23 ENCOUNTER — Telehealth: Payer: Self-pay

## 2016-02-23 ENCOUNTER — Ambulatory Visit (INDEPENDENT_AMBULATORY_CARE_PROVIDER_SITE_OTHER): Payer: BLUE CROSS/BLUE SHIELD | Admitting: Gastroenterology

## 2016-02-23 VITALS — BP 79/58 | HR 122 | Temp 97.2°F | Ht 65.0 in | Wt 188.4 lb

## 2016-02-23 DIAGNOSIS — K7460 Unspecified cirrhosis of liver: Secondary | ICD-10-CM

## 2016-02-23 DIAGNOSIS — R188 Other ascites: Secondary | ICD-10-CM | POA: Diagnosis not present

## 2016-02-23 DIAGNOSIS — R197 Diarrhea, unspecified: Secondary | ICD-10-CM | POA: Diagnosis not present

## 2016-02-23 MED ORDER — RIFAXIMIN 550 MG PO TABS: ORAL_TABLET | ORAL | Status: AC

## 2016-02-23 NOTE — Progress Notes (Signed)
cc'ed to pcp °

## 2016-02-23 NOTE — Progress Notes (Signed)
Subjective:    Patient ID: Erin Wilson, female    DOB: 25-Jan-1951, 65 y.o.   MRN: [id number]  Juliette AlcideBURDINE,STEVEN E, MD   HPI POOR APPETITE FOR PAST MANY MOS. FLUID BETTER CONTROLLED. WAS HAVING A #5 STOOL THEN WENT TO #6. HAVING LOOSE STOOLS BUT NOT WATERY THAT STARTED 3-4 DAYS AGO. DARK BROWN NOT BLACK. NO FEVER OR ABDOMINAL PAIN. WAS THROWING UP 2-3 DAYS-NO BLOOD. THINKS PULLED A MUSCLE AND HURTING IN MIDDLE OF CHEST. HAVING A LOT OF HEARTBURN/BURPING. NO RECENT ED VISIT. LAST TAP APR 3 6lS AND STOMACH HURT AFTERWARDS BUT NONE TODAY. HUSBAND FEELS LIKE SHE'S SLEEPING MORE. LACTULOSE ONCE A DAY INSTEAD OF TWICE. LAST DOSE-SUN.   PT DENIES FEVER, CHILLS, HEMATOCHEZIA, nausea, melena, CHEST PAIN, SHORTNESS OF BREATH,  constipation, abdominal pain, problems swallowing, OR problems with sedation.   Past Medical History  Diagnosis Date  . Hypertension   . Hyperlipidemia   . Diabetes mellitus without complication (HCC)   . Arthritis   . Cirrhosis (HCC)   . Ascites     Past Surgical History  Procedure Laterality Date  . Abdominal hysterectomy    . Appendectomy    . Carpal tunnel release Bilateral   . Shoulder surgery Right   . Esophagogastroduodenoscopy  08/21/14    Dr. Teena DunkBenson: portal gastropathy, no varices.  . Esophagogastroduodenoscopy N/A 02/11/2016       Allergies  Allergen Reactions  . Penicillins Hives, Swelling and Rash    Other reaction(s): Other (See Comments) All over body   Current Outpatient Prescriptions  Medication Sig Dispense Refill  . Albuterol Sulfate (VENTOLIN HFA IN) Inhale into the lungs.    Marland Kitchen. aspirin 81 MG tablet Take 81 mg by mouth daily.    Marland Kitchen. BYETTA 10 MCG PEN  SOPN injection     . FLEXERIL 10 MG tablet Take 10 mg by mouth TID as needed for muscle spasms.    Marland Kitchen. FLUoxetine (PROZAC) 40 MG capsule ONCE A DAY    . furosemide (LASIX) 40 MG tablet ONCE A DAY    . IMDUR 30 MG 24 hr tablet     . montelukast (SINGULAIR) 10 MG tablet Take 10 mg by mouth at  bedtime.    Marland Kitchen. NITROSTAT) 0.4 MG SL tablet TNT    . PRILOSEC) 20 MG capsule Take 20 mg by mouth daily.          . INDERAL LA 60 MG 24 hr capsule Take 1 capsule (60 mg total) by mouth daily.    Marland Kitchen. ALDACTONE 100 MG tablet Take 1 tablet QD. TAKE WITH LASIX EVERY MORNING    . traMADol (ULTRAM) 50 MG tablet  RARE    Review of Systems PER HPI OTHERWISE ALL SYSTEMS ARE NEGATIVE.     Objective:   Physical Exam  Constitutional: She is oriented to person, place, and time. She appears well-developed and well-nourished. No distress.  HENT:  Head: Normocephalic and atraumatic.  Mouth/Throat: Oropharynx is clear and moist. No oropharyngeal exudate.  Eyes: Pupils are equal, round, and reactive to light. No scleral icterus.  Neck: Normal range of motion. Neck supple.  Cardiovascular: Normal rate and regular rhythm.   Murmur heard. Pulmonary/Chest: Effort normal and breath sounds normal. No respiratory distress.  Abdominal: Soft. Bowel sounds are normal. She exhibits distension. There is no tenderness. There is no rebound and no guarding.  SHIFT IN DULLNESS present  Musculoskeletal: She exhibits edema (trace-1+ bil leS).  Lymphadenopathy:    She has no cervical adenopathy.  Neurological: She is alert and oriented to person, place, and time.  NO  NEW FOCAL DEFICITS  Psychiatric:  FLAT AFFECT, NL MOOD  Vitals reviewed.     Assessment & Plan:

## 2016-02-23 NOTE — Assessment & Plan Note (Signed)
LIKELY DUE TO MEDS, LESS LIKELY C DIFF.  SUBMIT STOOL STUDIES ASAP. CONTINUE TO MONITOR SYMPTOMS. STOP LACTULOSE. FOLLOW UP IN 6 WEEKS.

## 2016-02-23 NOTE — Addendum Note (Signed)
Addended by: West BaliFIELDS, Dereon Corkery L on: 02/23/2016 02:42 PM   Modules accepted: Orders

## 2016-02-23 NOTE — Telephone Encounter (Signed)
Opened in error

## 2016-02-23 NOTE — Assessment & Plan Note (Signed)
CONCERN FOR FATIGUE, ANOREXIA, WEIGHT LOSS- DIFFERENTIAL DIAGNOSIS INCLUDES: MEDS(PROPRANOLOL), UREMIA, OR HIGH AMMONIA.  COMPLETE LABS. ADD Burman BlacksmithXIFAXAN. CONTINUE TO MONITOR SYMPTOMS. OUTPATIENT VISIT IN 6 WEEKS

## 2016-02-23 NOTE — Progress Notes (Signed)
APPT MADE

## 2016-02-23 NOTE — Patient Instructions (Signed)
  SUBMIT STOOL STUDIES ASAP.  COMPLETE LABS.  START XIFAXAN TWICE DAILY TO LOWER AMMONIA LEVEL. USE LACTULOSEAS NEEDED IF YOU HAVE CONSTIPATION.  FOLLOW UP IN 6 WEEKS.

## 2016-02-23 NOTE — Assessment & Plan Note (Addendum)
SYMPTOMS NOT IDEALLY CONTROLLED BUT IMPROVED.  COMPLETE LABS. CONTINUE LASIX/ALDACTONE. WILL ADJUST AFTER LABS COMPLETE. OUTPATIENT VISIT IN 6 WEEKS

## 2016-02-23 NOTE — Progress Notes (Signed)
Noted. Lab orders and stool container given to pt at OV this AM.

## 2016-02-28 NOTE — Progress Notes (Signed)
Tried to do a PA for xifaxan. Medication is not covered by her insurance at all. I have filled out xifaxan patient assistance forms. They will need to be signed by a provider and they will be faxed to the pt assistance co.

## 2016-03-13 DEATH — deceased

## 2016-04-05 ENCOUNTER — Telehealth: Payer: Self-pay | Admitting: Gastroenterology

## 2016-04-05 ENCOUNTER — Encounter (INDEPENDENT_AMBULATORY_CARE_PROVIDER_SITE_OTHER): Payer: BLUE CROSS/BLUE SHIELD | Admitting: Gastroenterology

## 2016-04-05 ENCOUNTER — Encounter: Payer: Self-pay | Admitting: Gastroenterology

## 2016-04-05 NOTE — Telephone Encounter (Signed)
PATIENT WAS A NO SHOW AND LETTER SENT  °

## 2016-04-05 NOTE — Progress Notes (Signed)
   Subjective:    Patient ID: Erin LemmingsJoan C Myron, female    DOB: [birth year], 65 y.o.   MRN: [id number]  HPI  Past Medical History  Diagnosis Date  . Hypertension   . Hyperlipidemia   . Diabetes mellitus without complication (HCC)   . Arthritis   . Cirrhosis (HCC)   . Ascites      Review of Systems     Objective:   Physical Exam        Assessment & Plan:

## 2016-04-05 NOTE — Telephone Encounter (Signed)
REVIEWED-NO ADDITIONAL RECOMMENDATIONS. 

## 2016-04-11 NOTE — Telephone Encounter (Signed)
T/C from family to let us know that pt passed away in April 2017.  I expressed our condolences to them.

## 2016-07-18 ENCOUNTER — Ambulatory Visit: Payer: BLUE CROSS/BLUE SHIELD | Admitting: Gastroenterology

## 2017-01-12 ENCOUNTER — Encounter: Payer: Self-pay | Admitting: Vascular Surgery

## 2017-01-24 ENCOUNTER — Encounter (HOSPITAL_COMMUNITY): Payer: Self-pay

## 2017-01-24 ENCOUNTER — Ambulatory Visit: Payer: Self-pay | Admitting: Vascular Surgery

## 2017-09-28 IMAGING — US US PARACENTESIS
1 series · 4 of 4 positions shown · non-contrast
Comparison: none

INDICATION: Cirrhosis, ascites

[Series 1: us paracentesis · 0.32mm/px · 4 of 4 slices shown]
[im 1/4]
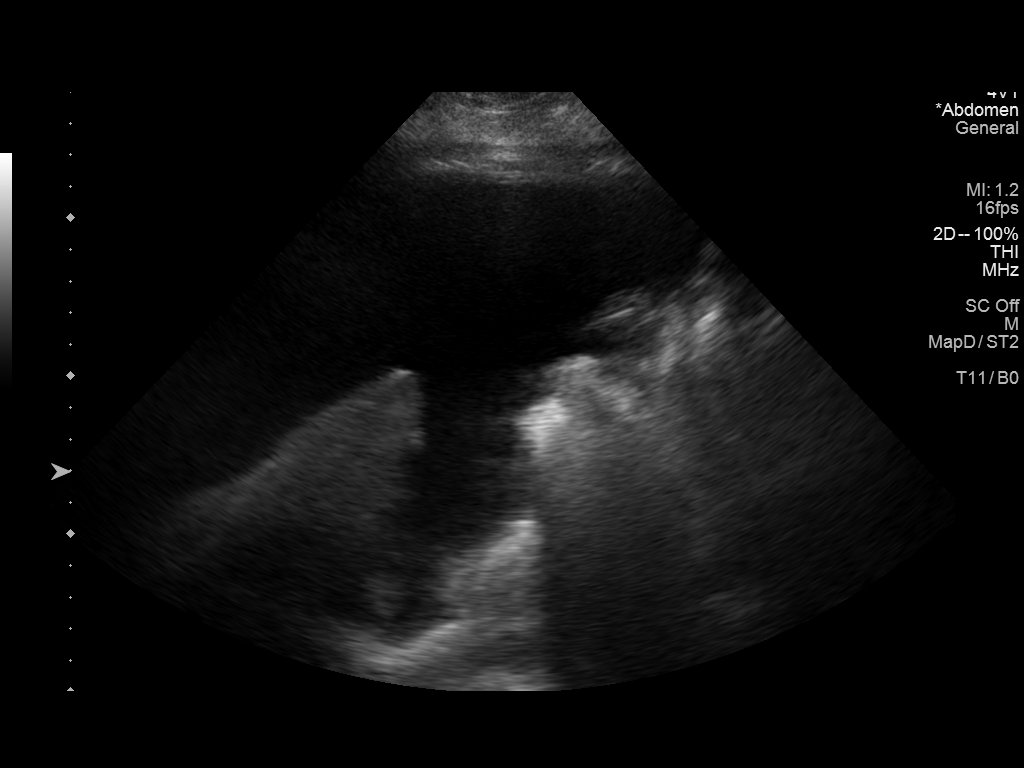
[im 2/4]
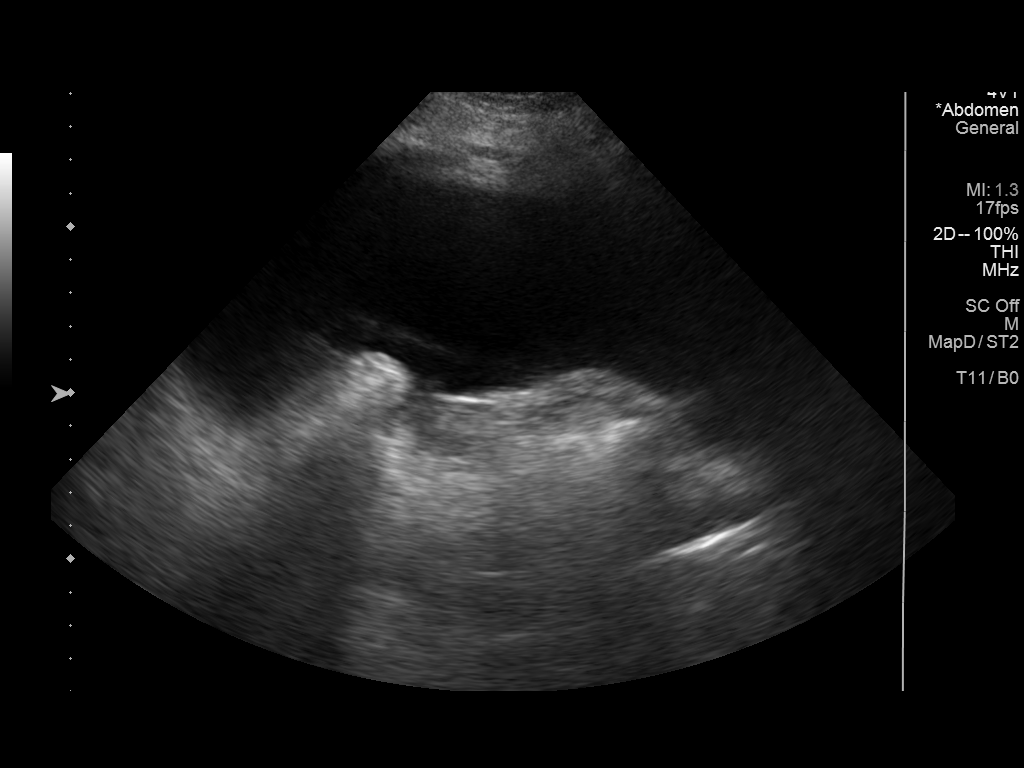
[im 3/4]
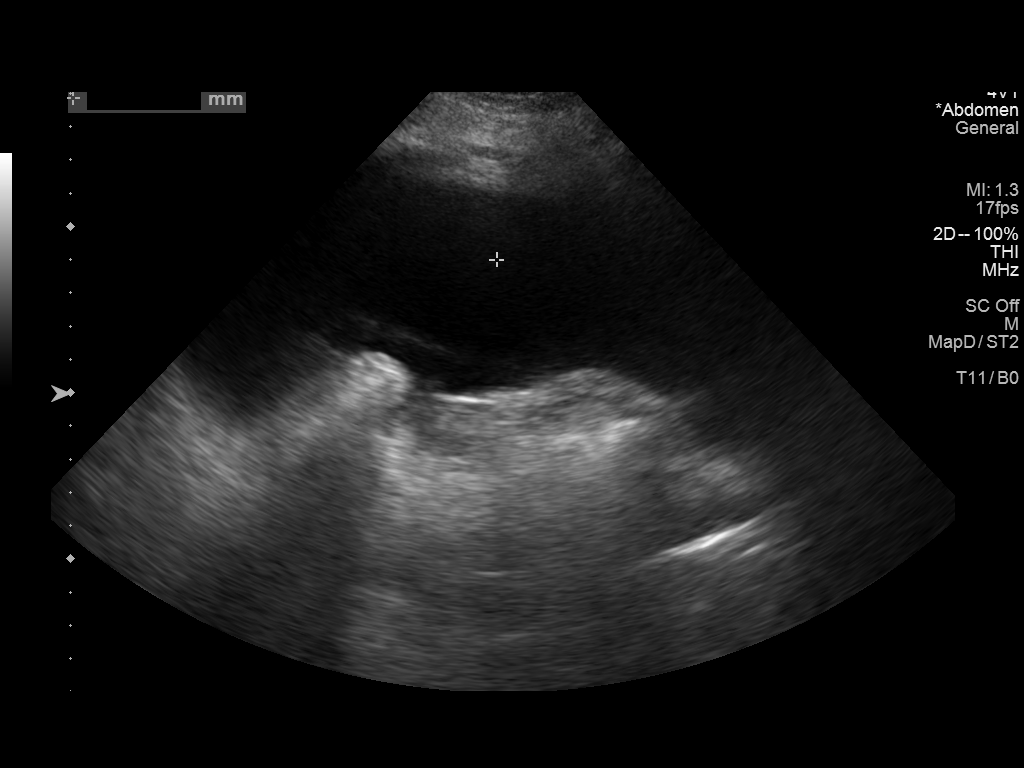
[im 4/4]
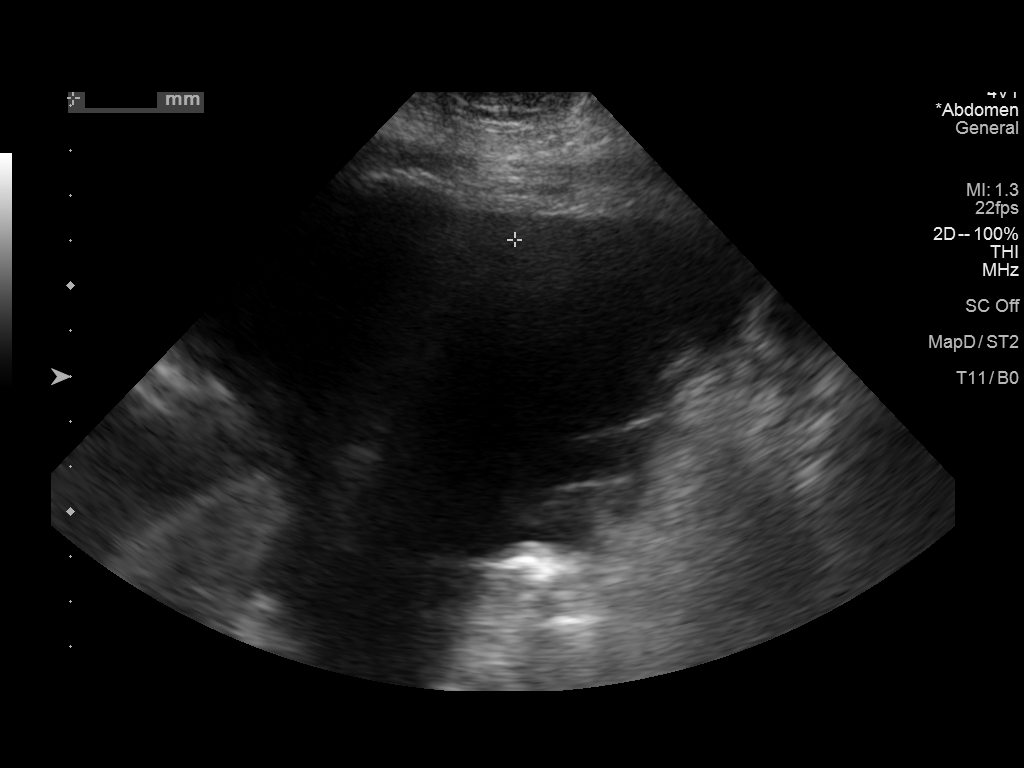

[4 of 4 positions shown; findings below may reference images not displayed]

EXAM:
ULTRASOUND GUIDED  PARACENTESIS

MEDICATIONS:
None.

COMPLICATIONS:
None immediate.

PROCEDURE:
Informed written consent was obtained from the patient after a
discussion of the risks, benefits and alternatives to treatment. A
timeout was performed prior to the initiation of the procedure.

Initial ultrasound scanning demonstrates a moderate amount of
ascites within the right lower abdominal quadrant. The right lower
abdomen was prepped and draped in the usual sterile fashion. 1%
lidocaine with epinephrine was used for local anesthesia.

Following this, a 19 gauge, 7-cm, Yueh catheter was introduced. An
ultrasound image was saved for documentation purposes. The
paracentesis was performed. The catheter was removed and a dressing
was applied. The patient tolerated the procedure well without
immediate post procedural complication.
FINDINGS: A total of approximately 2.6 L of amber fluid was removed.
IMPRESSION: Successful ultrasound-guided paracentesis yielding 2.6 liters of
peritoneal fluid.

## 2017-09-28 IMAGING — US US ABDOMEN LIMITED
1 series · 8 of 8 positions shown · non-contrast
Comparison: None.

CLINICAL DATA: Cirrhosis, evaluate for ascites

EXAM:
LIMITED ABDOMEN ULTRASOUND FOR ASCITES
TECHNIQUE: Limited ultrasound survey for ascites was performed in all four
abdominal quadrants.

[Series 1: us abdomen limited · 0.36mm/px · 8 of 8 slices shown]
[im 1/8]
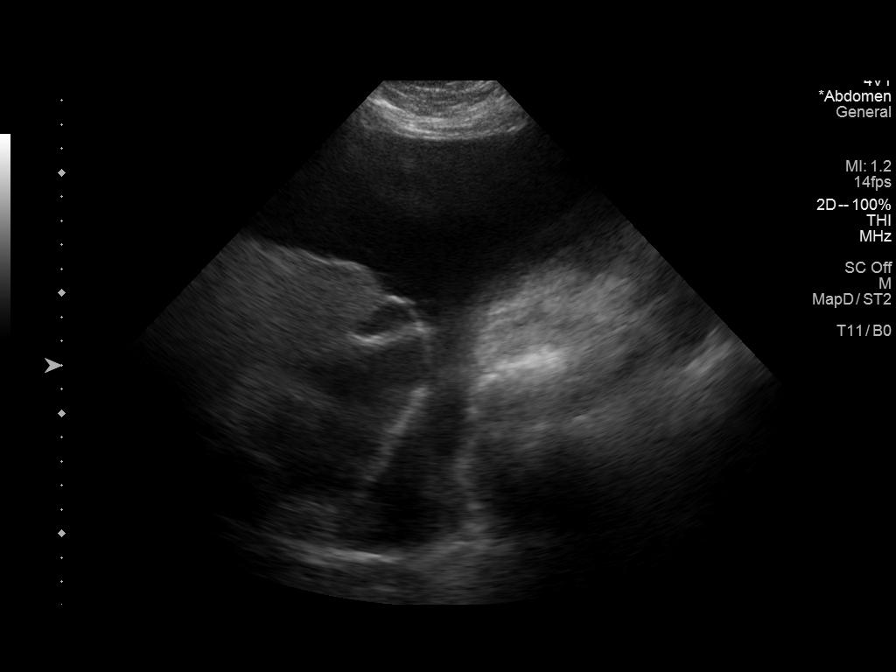
[im 2/8]
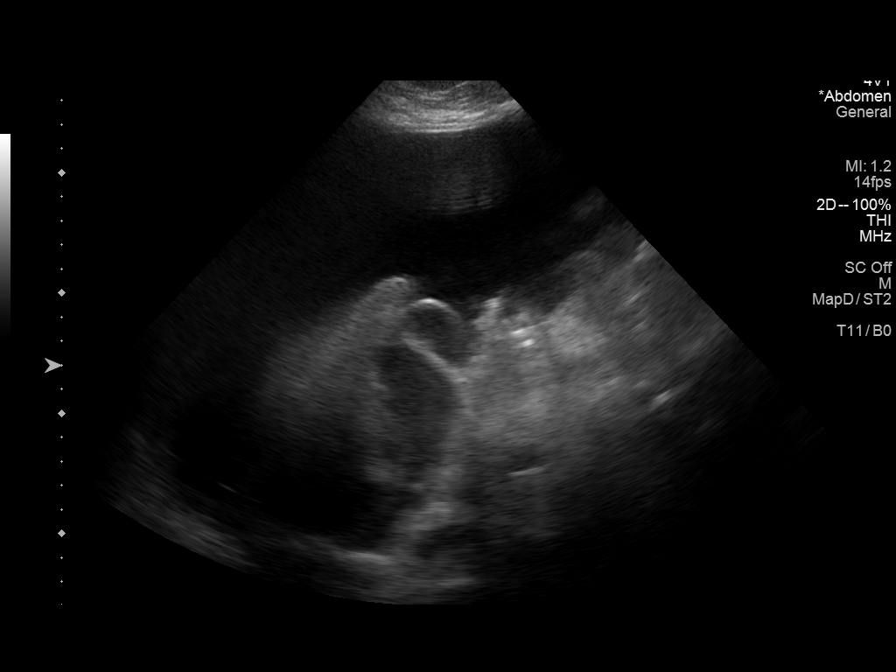
[im 3/8]
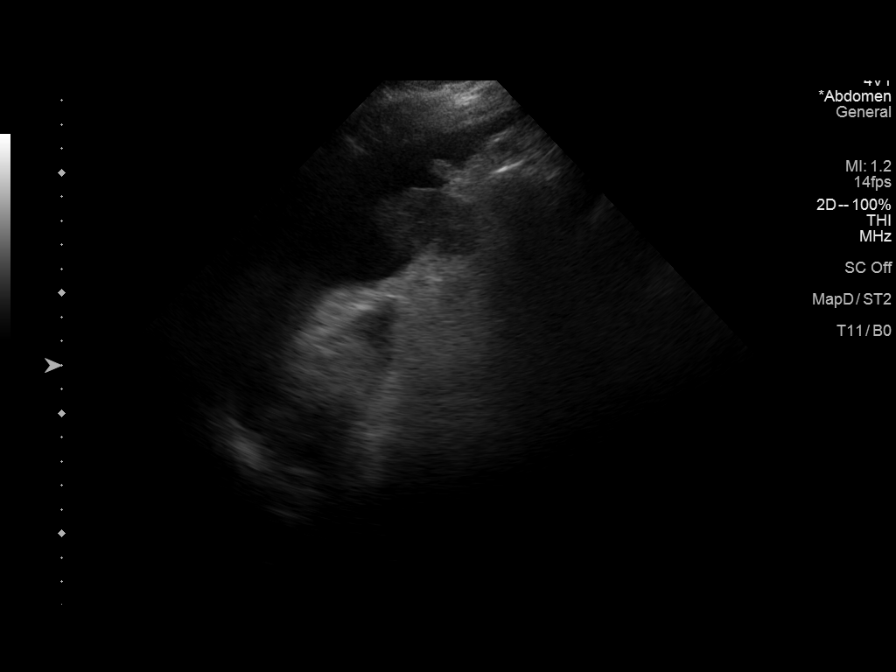
[im 4/8]
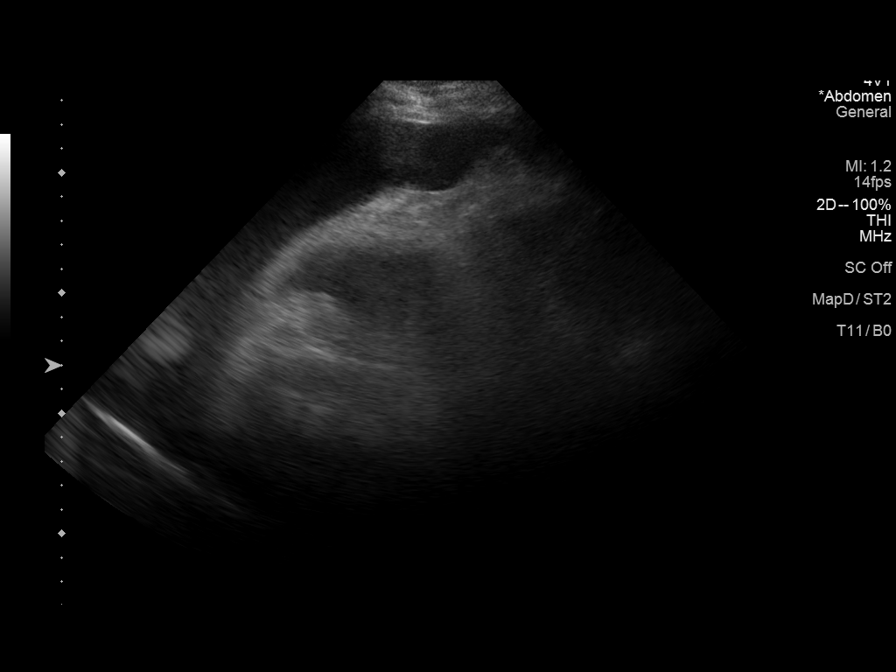
[im 5/8]
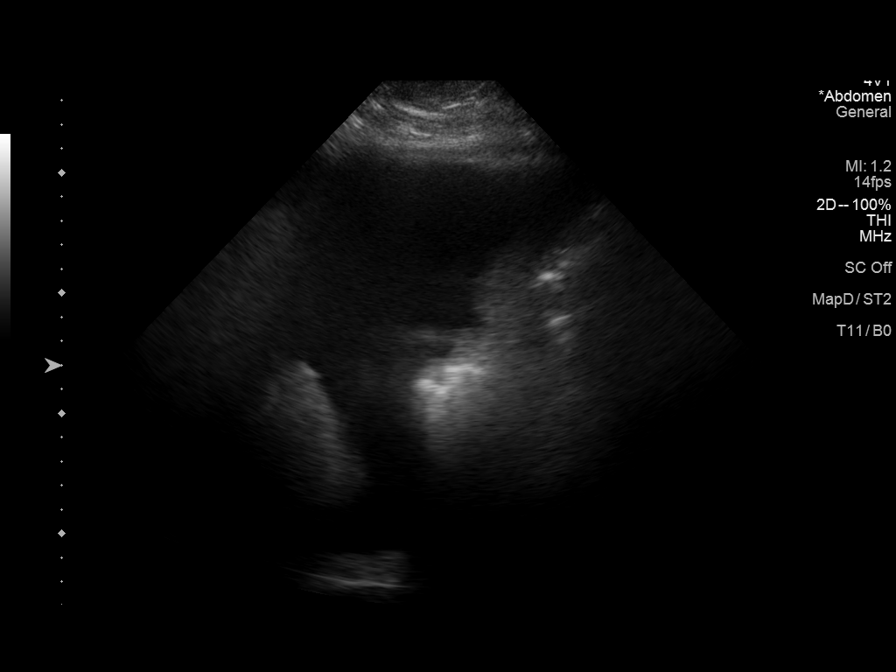
[im 6/8]
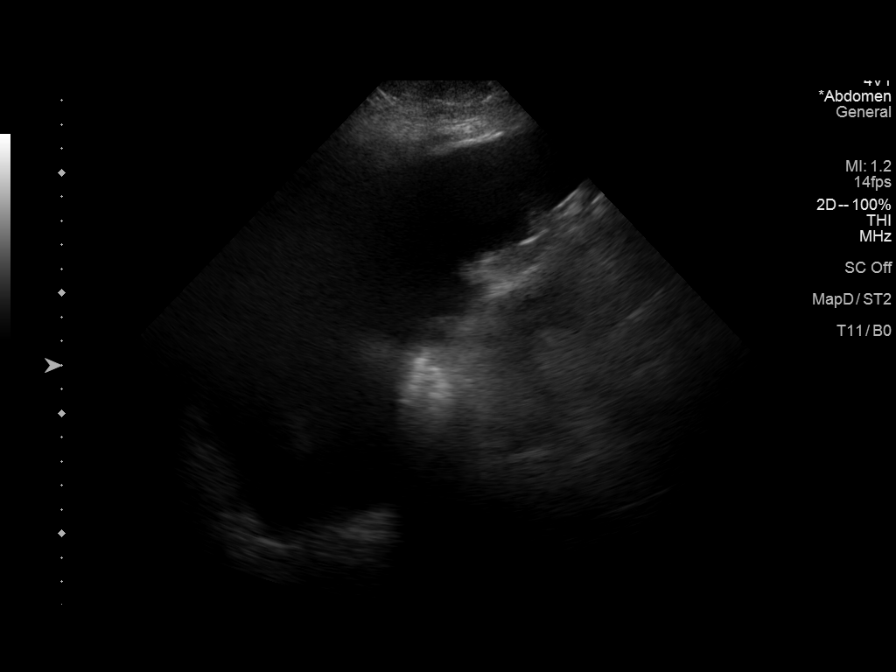
[im 7/8]
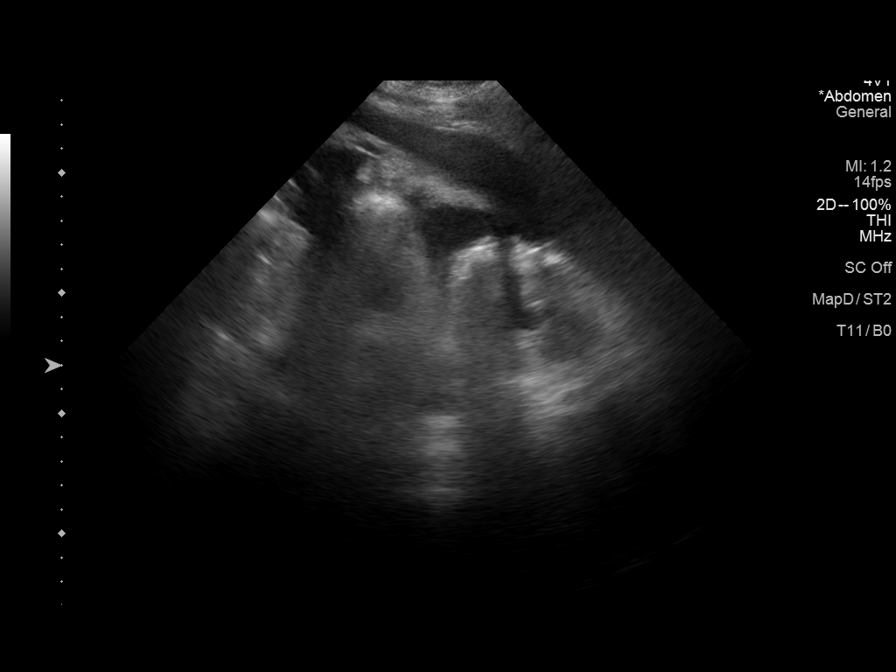
[im 8/8]
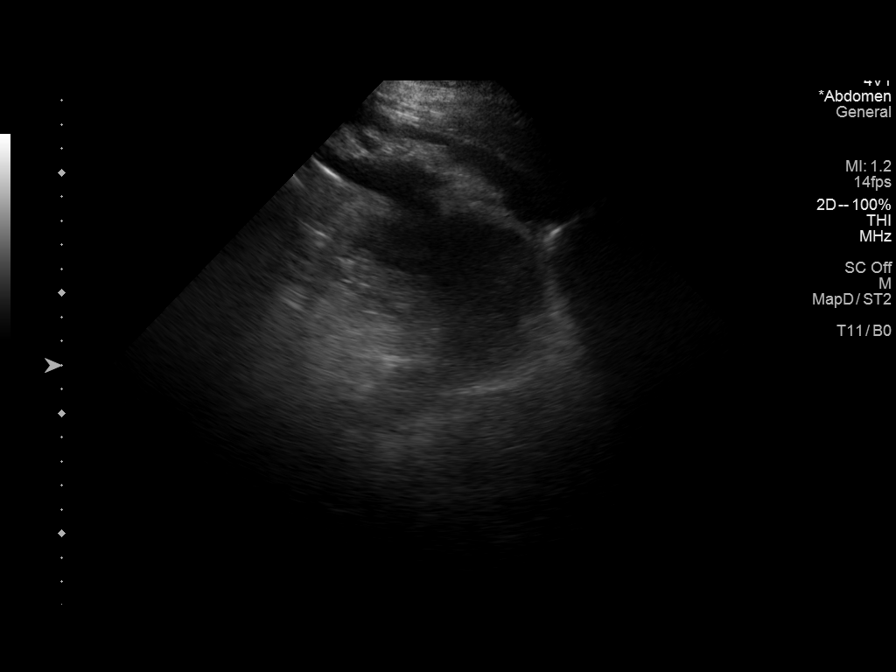

[8 of 8 positions shown; findings below may reference images not displayed]

FINDINGS: Cirrhotic configuration of the liver.

Moderate abdominopelvic ascites, particularly in the right upper and
lower abdomen.
IMPRESSION: Moderate abdominopelvic ascites, particularly in the right abdomen.

## 2017-10-09 IMAGING — US US PARACENTESIS
1 series · 5 of 5 positions shown · non-contrast
Comparison: none

INDICATION: Ascites

[Series 1: us paracentesis · 0.33mm/px · 5 of 5 slices shown]
[im 1/5]
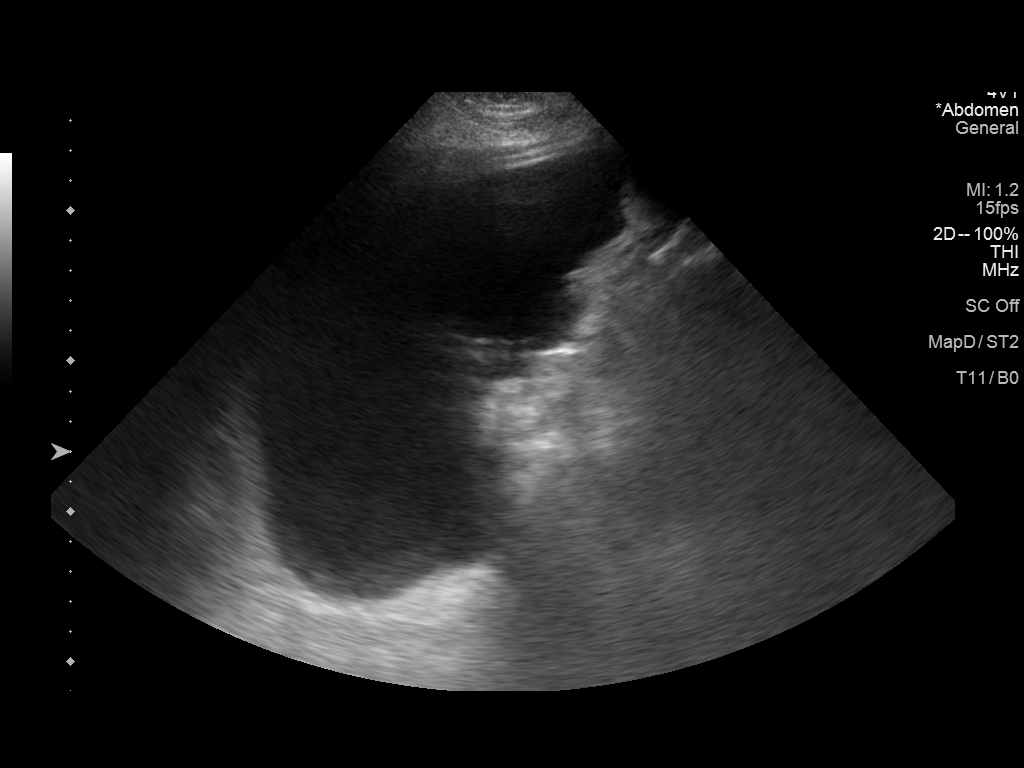
[im 2/5]
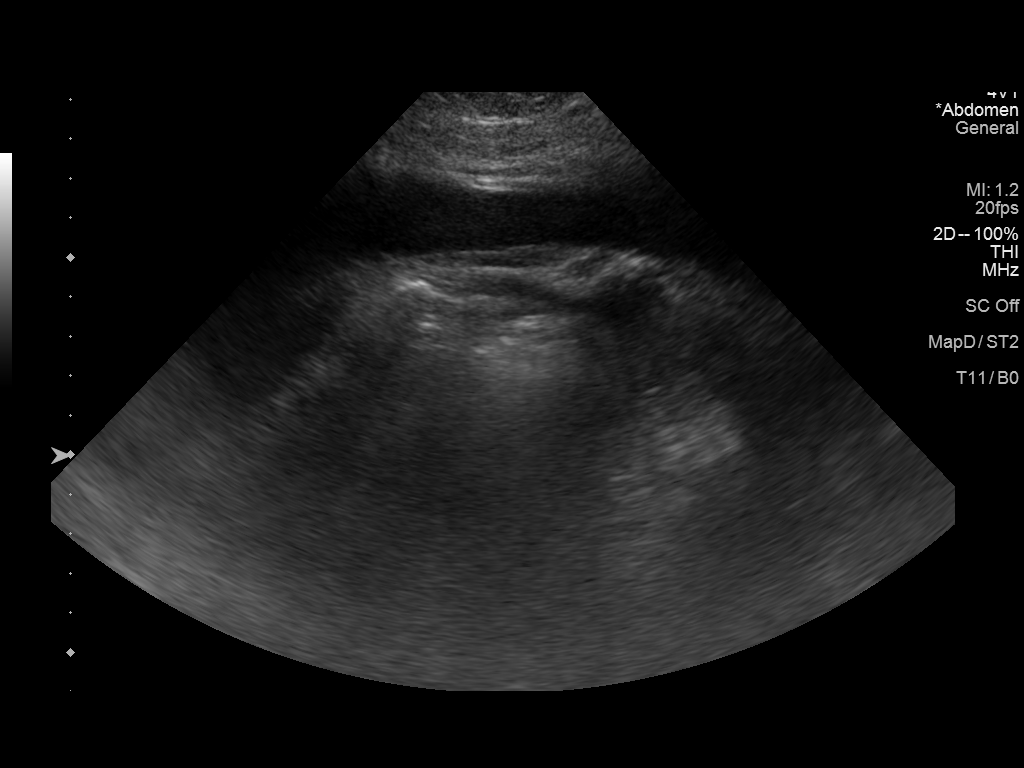
[im 3/5]
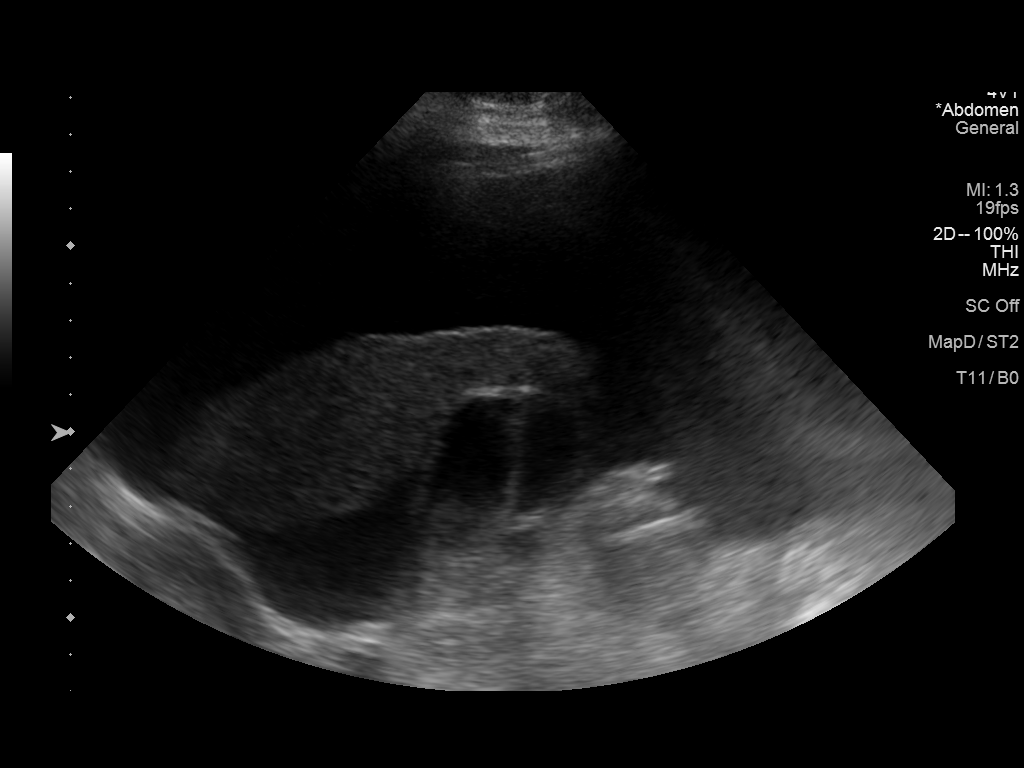
[im 4/5]
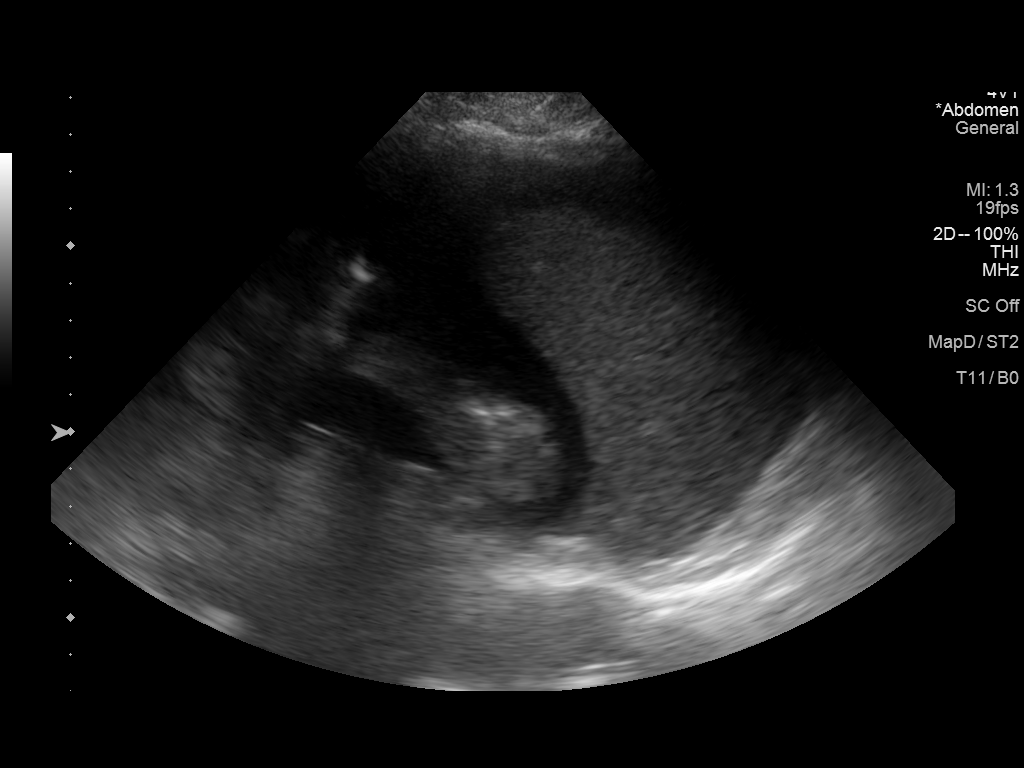
[im 5/5]
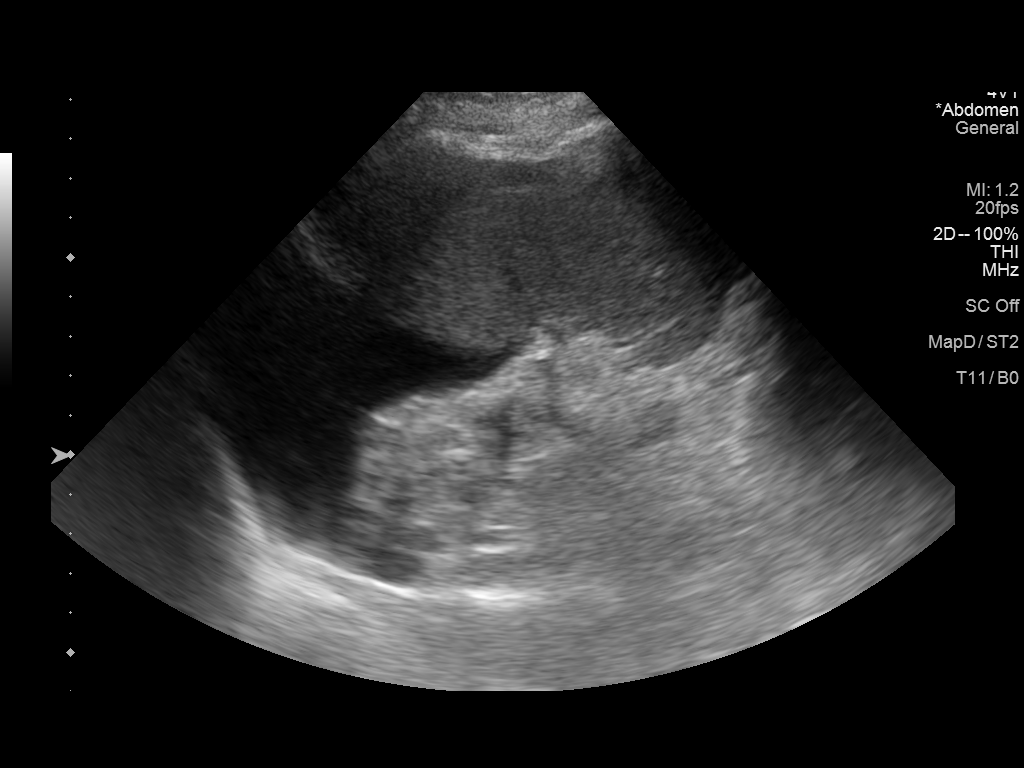

[5 of 5 positions shown; findings below may reference images not displayed]

EXAM:
ULTRASOUND GUIDED  PARACENTESIS

MEDICATIONS:
None.

COMPLICATIONS:
None immediate.

PROCEDURE:
Informed written consent was obtained from the patient after a
discussion of the risks, benefits and alternatives to treatment. A
timeout was performed prior to the initiation of the procedure.

Initial ultrasound scanning demonstrates a large amount of ascites
within the right lower abdominal quadrant. The right lower abdomen
was prepped and draped in the usual sterile fashion. 1% lidocaine
with epinephrine was used for local anesthesia.

Following this, a 19 gauge, 7-cm, Yueh catheter was introduced. An
ultrasound image was saved for documentation purposes. The
paracentesis was performed. The catheter was removed and a dressing
was applied. The patient tolerated the procedure well without
immediate post procedural complication.
FINDINGS: A total of approximately 6 L of clear yellow fluid was removed.
IMPRESSION: Successful ultrasound-guided paracentesis yielding 6 L liters of
peritoneal fluid.
